# Patient Record
Sex: Male | Born: 1965 | Race: Asian | Hispanic: No | Marital: Single | Smoking: Current every day smoker
Health system: Southern US, Community
[De-identification: ages and names within clinical notes are randomized; demographics above are authoritative.]

## PROBLEM LIST (undated history)

## (undated) DIAGNOSIS — E7800 Pure hypercholesterolemia, unspecified: Secondary | ICD-10-CM

## (undated) DIAGNOSIS — E663 Overweight: Secondary | ICD-10-CM

## (undated) DIAGNOSIS — F419 Anxiety disorder, unspecified: Secondary | ICD-10-CM

## (undated) DIAGNOSIS — E119 Type 2 diabetes mellitus without complications: Secondary | ICD-10-CM

## (undated) DIAGNOSIS — R7989 Other specified abnormal findings of blood chemistry: Secondary | ICD-10-CM

## (undated) HISTORY — DX: Overweight: E66.3

## (undated) HISTORY — DX: Type 2 diabetes mellitus without complications: E11.9

## (undated) HISTORY — DX: Pure hypercholesterolemia, unspecified: E78.00

## (undated) HISTORY — DX: Other specified abnormal findings of blood chemistry: R79.89

## (undated) HISTORY — PX: OTHER SURGICAL HISTORY: SHX169

## (undated) HISTORY — DX: Anxiety disorder, unspecified: F41.9

---

## 2016-02-12 HISTORY — PX: COLONOSCOPY: SHX174

## 2019-06-23 ENCOUNTER — Encounter: Payer: Self-pay | Admitting: Nurse Practitioner

## 2019-06-23 ENCOUNTER — Ambulatory Visit (INDEPENDENT_AMBULATORY_CARE_PROVIDER_SITE_OTHER): Payer: BC Managed Care – PPO | Admitting: Nurse Practitioner

## 2019-06-23 ENCOUNTER — Other Ambulatory Visit: Payer: Self-pay

## 2019-06-23 VITALS — BP 120/70 | HR 78 | Temp 96.9°F | Ht 69.0 in | Wt 233.0 lb

## 2019-06-23 DIAGNOSIS — R7989 Other specified abnormal findings of blood chemistry: Secondary | ICD-10-CM

## 2019-06-23 DIAGNOSIS — E1165 Type 2 diabetes mellitus with hyperglycemia: Secondary | ICD-10-CM

## 2019-06-23 DIAGNOSIS — J302 Other seasonal allergic rhinitis: Secondary | ICD-10-CM

## 2019-06-23 DIAGNOSIS — E669 Obesity, unspecified: Secondary | ICD-10-CM

## 2019-06-23 DIAGNOSIS — E782 Mixed hyperlipidemia: Secondary | ICD-10-CM

## 2019-06-23 DIAGNOSIS — F17200 Nicotine dependence, unspecified, uncomplicated: Secondary | ICD-10-CM

## 2019-06-23 DIAGNOSIS — F324 Major depressive disorder, single episode, in partial remission: Secondary | ICD-10-CM

## 2019-06-23 DIAGNOSIS — F5101 Primary insomnia: Secondary | ICD-10-CM

## 2019-06-23 DIAGNOSIS — N529 Male erectile dysfunction, unspecified: Secondary | ICD-10-CM

## 2019-06-23 NOTE — Patient Instructions (Addendum)
  DENTIST Dr Linus Galas [address]  [place] [address]  [phone]    Please work on quitting smoking

## 2019-06-23 NOTE — Progress Notes (Signed)
Careteam: Patient Care Team: System, Pcp Not In as PCP - General  PLACE OF SERVICE:  [place]  Advanced Directive information    Allergies  Allergen Reactions  . Penicillins Rash    Chief Complaint  Patient presents with  . Establish Care    New Patient establish care and fasting labs  . Medication Management    FYI- patient was on Lipitor- not sure of dose and ran out  . Medication Refill    Refill all medications EXCEPT abilify. Patient will need testerone injection (has 2 doses left)      HPI: Patient is a 54 y.o. male to establish care.  Last physical was November 2020.  Depression- controlled on abilify 10 mg daily, previously followed by psychiatrist. Reports he was very depressed in the past but no SI or HI. Now stable and has been on abifiy for some time.   Insomnia- controlled on diphenhydramine 50 mg daily.   DM- last a1c 7.1 in October, currently on trulicitiy, glipizide and metformin 1000 mg daily,  Previous on twice daily but decreased due to improved glycemia control. No hypoglycemia.  Does not routinely take blood sugar  Uses ibuprofen occasionally for pain- once every 2 months.   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   Hyperlipidemia- previously on lipitor but has been out for a few months, fasting today  Right rotator cuff repair after accident last year.   Smoker- smoking for 30 years, 1/2 ppd  Works for [employer] in [place] and was relocated from [place].   Obesity- weight was 253 in [place], has lost 20 lbs with walking.  0 Review of Systems:  Review of Systems  Constitutional: Negative for chills, fever and weight loss.  HENT: Negative for tinnitus.   Respiratory: Negative for cough, sputum production and shortness of breath.   Cardiovascular: Negative for chest pain, palpitations and leg swelling.  Gastrointestinal: Negative for abdominal pain, constipation, diarrhea and heartburn.    Genitourinary: Negative for dysuria, frequency and urgency.  Musculoskeletal: Negative for back pain, falls, joint pain and myalgias.  Skin: Negative.   Neurological: Negative for dizziness and headaches.  Endo/Heme/Allergies: Positive for environmental allergies.  Psychiatric/Behavioral: Positive for depression. Negative for memory loss. The patient has insomnia.     Past Medical History:  Diagnosis Date  . Anxiety   . Diabetes ([place])   . High cholesterol   . Low testosterone in male   . Overweight    Past Surgical History:  Procedure Laterality Date  . COLONOSCOPY  2018  . None     Social History:   reports that he has been smoking cigarettes. He has a 15.00 pack-year smoking history. He has never used smokeless tobacco. He reports previous alcohol use. He reports that he does not use drugs.  Family History  Problem Relation Age of Onset  . Stroke Mother   . Stroke Father     Medications: Patient's Medications  New Prescriptions   No medications on file  Previous Medications   ARIPIPRAZOLE (ABILIFY) 10 MG TABLET    Take 10 mg by mouth daily.   DIPHENHYDRAMINE HCL, SLEEP, (SLEEP AID) 50 MG CAPS    Take 1 capsule by mouth at bedtime.   DULAGLUTIDE (TRULICITY) 4.49 QP/5.9FM SOPN    Inject 0.75 mg into the skin once a week.   GLIPIZIDE (GLUCOTROL) 5 MG TABLET    Take 5 mg by mouth daily before breakfast.  IBUPROFEN (ADVIL) 200 MG TABLET    Take 200 mg by mouth as needed.   METFORMIN (GLUMETZA) 1000 MG (MOD) 24 HR TABLET    Take 1,000 mg by mouth daily.    SILDENAFIL (VIAGRA) 100 MG TABLET    Take 100 mg by mouth as needed for erectile dysfunction.   TESTOSTERONE IM    Inject 25 mg into the muscle See admin instructions. Every 10 days  Modified Medications   No medications on file  Discontinued Medications   UNABLE TO FIND    25 mg daily. Med Name: Arpizol    Physical Exam:  Vitals:   06/23/19 0904  BP: 120/70  Pulse: 78  Temp: (!) 96.9 F (36.1 C)  TempSrc:  Temporal  SpO2: 99%  Weight: 233 lb (105.7 kg)  Height: '5\' 9"'$  (1.753 m)   Body mass index is 34.41 kg/m. Wt Readings from Last 3 Encounters:  06/23/19 233 lb (105.7 kg)    Physical Exam Constitutional:      General: He is not in acute distress.    Appearance: He is well-developed. He is not diaphoretic.  HENT:     Head: Normocephalic and atraumatic.     Mouth/Throat:     Pharynx: No oropharyngeal exudate.  Eyes:     Conjunctiva/sclera: Conjunctivae normal.     Pupils: Pupils are equal, round, and reactive to light.  Cardiovascular:     Rate and Rhythm: Normal rate and regular rhythm.     Heart sounds: Normal heart sounds.  Pulmonary:     Effort: Pulmonary effort is normal.     Breath sounds: Normal breath sounds.  Abdominal:     General: Bowel sounds are normal.     Palpations: Abdomen is soft.  Musculoskeletal:        General: No tenderness.     Cervical back: Normal range of motion and neck supple.  Skin:    General: Skin is warm and dry.  Neurological:     Mental Status: He is alert and oriented to person, place, and time.  Psychiatric:        Mood and Affect: Mood normal.        Behavior: Behavior normal.    Labs reviewed: Basic Metabolic Panel: No results for input(s): NA, K, CL, CO2, GLUCOSE, BUN, CREATININE, CALCIUM, MG, PHOS, TSH in the last 8760 hours. Liver Function Tests: No results for input(s): AST, ALT, ALKPHOS, BILITOT, PROT, ALBUMIN in the last 8760 hours. No results for input(s): LIPASE, AMYLASE in the last 8760 hours. No results for input(s): AMMONIA in the last 8760 hours. CBC: No results for input(s): WBC, NEUTROABS, HGB, HCT, MCV, PLT in the last 8760 hours. Lipid Panel: No results for input(s): CHOL, HDL, LDLCALC, TRIG, CHOLHDL, LDLDIRECT in the last 8760 hours. TSH: No results for input(s): TSH in the last 8760 hours. A1C: No results found for: HGBA1C   Assessment/Plan 1. Low testosterone -continues on testosterone injections,  previously prescribed by urology, reports he had a hard time getting level up and this is why he takes every 10 days, medical records have been requested.  - Ambulatory referral to Urology  2. Type 2 diabetes mellitus with hyperglycemia, without long-term current use of insulin (HCC) -reports A1c has been trending down. Does not check blood sugars routinely but reports no symptoms of hypoglycemia.  -Encouraged dietary compliance, routine foot care/monitoring and to keep up with diabetic eye exams through ophthalmology  - Ambulatory referral to Podiatry - Ambulatory referral to Ophthalmology -  CMP with eGFR([facility]) - CBC with Differential/Platelet - Hemoglobin A1c  3. Mixed hyperlipidemia -previously on lipitor but ran out, has made lifestyle modifications and lost weight.  -goal LDL <70, likely will need to restart statin. To continue dietary modifications and exercise. - Lipid Panel - CMP with eGFR([facility])  4. Obesity (BMI 30-39.9) -has lost weight, continues to work on dietary and increase in physical activity for healthy lifestyle.   5. Seasonal allergies -taking diphenhydramine nightly for sleep, continues on for allergy relief.    6. Depression, major, single episode, in partial remission ([place]) -reports this is well controlled at this time, previously followed by psych but no recent adjustments needed in medications. Continues on abilify 10 mg daily  7. Primary insomnia Stable at this time.  8. Erectile dysfunction, unspecified erectile dysfunction type -continues on viagra 100 mg PRN, followed by urology  9. Smoker Discussed and encouraged smoking cessation.   Next appt: 3 months for follow up.  James Woods. [place], [facility] [phone]

## 2019-06-24 ENCOUNTER — Other Ambulatory Visit: Payer: Self-pay | Admitting: Nurse Practitioner

## 2019-06-24 ENCOUNTER — Other Ambulatory Visit: Payer: Self-pay

## 2019-06-24 DIAGNOSIS — E782 Mixed hyperlipidemia: Secondary | ICD-10-CM

## 2019-06-24 LAB — COMPLETE METABOLIC PANEL WITH GFR
AG Ratio: 1.3 (calc) (ref 1.0–2.5)
ALT: 21 U/L (ref 9–46)
AST: 18 U/L (ref 10–35)
Albumin: 4.4 g/dL (ref 3.6–5.1)
Alkaline phosphatase (APISO): 74 U/L (ref 35–144)
BUN: 13 mg/dL (ref 7–25)
CO2: 28 mmol/L (ref 20–32)
Calcium: 9.5 mg/dL (ref 8.6–10.3)
Chloride: 100 mmol/L (ref 98–110)
Creat: 0.94 mg/dL (ref 0.70–1.33)
GFR, Est African American: 108 mL/min/{1.73_m2} (ref >=60)
GFR, Est Non African American: 93 mL/min/{1.73_m2} (ref >=60)
Globulin: 3.4 g/dL (calc) (ref 1.9–3.7)
Glucose, Bld: 114 mg/dL — ABNORMAL HIGH (ref 65–99)
Potassium: 4.8 mmol/L (ref 3.5–5.3)
Sodium: 134 mmol/L — ABNORMAL LOW (ref 135–146)
Total Bilirubin: 0.5 mg/dL (ref 0.2–1.2)
Total Protein: 7.8 g/dL (ref 6.1–8.1)

## 2019-06-24 LAB — CBC WITH DIFFERENTIAL/PLATELET
Absolute Monocytes: 380 cells/uL (ref 200–950)
Basophils Absolute: 73 cells/uL (ref 0–200)
Basophils Relative: 1 %
Eosinophils Absolute: 292 cells/uL (ref 15–500)
Eosinophils Relative: 4 %
HCT: 50.3 % — ABNORMAL HIGH (ref 38.5–50.0)
Hemoglobin: 16.2 g/dL (ref 13.2–17.1)
Lymphs Abs: 2095 cells/uL (ref 850–3900)
MCH: 25.8 pg — ABNORMAL LOW (ref 27.0–33.0)
MCHC: 32.2 g/dL (ref 32.0–36.0)
MCV: 80 fL (ref 80.0–100.0)
MPV: 9 fL (ref 7.5–12.5)
Monocytes Relative: 5.2 %
Neutro Abs: 4460 cells/uL (ref 1500–7800)
Neutrophils Relative %: 61.1 %
Platelets: 175 10*3/uL (ref 140–400)
RBC: 6.29 10*6/uL — ABNORMAL HIGH (ref 4.20–5.80)
RDW: 16.1 % — ABNORMAL HIGH (ref 11.0–15.0)
Total Lymphocyte: 28.7 %
WBC: 7.3 10*3/uL (ref 3.8–10.8)

## 2019-06-24 LAB — HEMOGLOBIN A1C
Hgb A1c MFr Bld: 6.2 % of total Hgb — ABNORMAL HIGH (ref <5.7)
Mean Plasma Glucose: 131 (calc)
eAG (mmol/L): 7.3 (calc)

## 2019-06-24 LAB — LIPID PANEL
Cholesterol: 202 mg/dL — ABNORMAL HIGH (ref <200)
HDL: 27 mg/dL — ABNORMAL LOW (ref >=40)
LDL Cholesterol (Calc): 141 mg/dL (calc) — ABNORMAL HIGH
Non-HDL Cholesterol (Calc): 175 mg/dL (calc) — ABNORMAL HIGH (ref <130)
Total CHOL/HDL Ratio: 7.5 (calc) — ABNORMAL HIGH (ref <5.0)
Triglycerides: 198 mg/dL — ABNORMAL HIGH (ref <150)

## 2019-06-24 MED ORDER — ATORVASTATIN CALCIUM 20 MG PO TABS
20.0000 mg | ORAL_TABLET | Freq: Every day | ORAL | 0 refills | Status: DC
Start: 2019-06-24 — End: 2019-06-25

## 2019-06-24 NOTE — Progress Notes (Signed)
Per PCP Sharon Seller, NP patient is to start 1/2 tablet for 2 week then increase to 1 tablet. Note added for pharmacist to remind patient.

## 2019-06-25 ENCOUNTER — Telehealth: Payer: Self-pay | Admitting: *Deleted

## 2019-06-25 MED ORDER — GLIPIZIDE 5 MG PO TABS: 5.0000 mg | ORAL_TABLET | Freq: Two times a day (BID) | ORAL | Status: DC

## 2019-06-25 NOTE — Telephone Encounter (Signed)
Patient wife, Samen walked into office with patient's medications. Reviewed medications and made corrections on medication list. Showed Jessica list and received verbal approval to change in patient's medication list. Updated current medication list.

## 2019-06-30 ENCOUNTER — Other Ambulatory Visit: Payer: Self-pay

## 2019-06-30 NOTE — Telephone Encounter (Signed)
Patient stated he had no more medication. I asked him if he had any refills left at his previous pharmacy and he denied having any refills. He stated he moved here from [place] and his doctor there would not treat him since he was out of state and he thought that we would refill his medications. I stated that we had not received his records and did not know how to dose the medication. He eventually hung up on me.

## 2019-06-30 NOTE — Telephone Encounter (Addendum)
James Woods said she had made a referral to [facility] and that they would handle ordering the testosterone and associated supplies.  I called patient and relayed that information. A referral was made, but the patient stated he had not been contacted yet.

## 2019-08-04 ENCOUNTER — Ambulatory Visit: Payer: BC Managed Care – PPO | Admitting: Podiatry

## 2019-08-18 DIAGNOSIS — H0102B Squamous blepharitis left eye, upper and lower eyelids: Secondary | ICD-10-CM | POA: Diagnosis not present

## 2019-08-18 DIAGNOSIS — H52223 Regular astigmatism, bilateral: Secondary | ICD-10-CM | POA: Diagnosis not present

## 2019-08-18 DIAGNOSIS — E119 Type 2 diabetes mellitus without complications: Secondary | ICD-10-CM | POA: Diagnosis not present

## 2019-08-18 DIAGNOSIS — Z125 Encounter for screening for malignant neoplasm of prostate: Secondary | ICD-10-CM | POA: Diagnosis not present

## 2019-08-18 DIAGNOSIS — E349 Endocrine disorder, unspecified: Secondary | ICD-10-CM | POA: Diagnosis not present

## 2019-08-18 DIAGNOSIS — H0102A Squamous blepharitis right eye, upper and lower eyelids: Secondary | ICD-10-CM | POA: Diagnosis not present

## 2019-08-18 DIAGNOSIS — H5203 Hypermetropia, bilateral: Secondary | ICD-10-CM | POA: Diagnosis not present

## 2019-08-18 DIAGNOSIS — H524 Presbyopia: Secondary | ICD-10-CM | POA: Diagnosis not present

## 2019-08-18 DIAGNOSIS — H04123 Dry eye syndrome of bilateral lacrimal glands: Secondary | ICD-10-CM | POA: Diagnosis not present

## 2019-08-18 LAB — HM DIABETES EYE EXAM

## 2019-08-19 ENCOUNTER — Ambulatory Visit: Payer: BC Managed Care – PPO | Admitting: Podiatry

## 2019-08-19 ENCOUNTER — Other Ambulatory Visit: Payer: Self-pay

## 2019-08-19 DIAGNOSIS — E119 Type 2 diabetes mellitus without complications: Secondary | ICD-10-CM | POA: Diagnosis not present

## 2019-08-19 DIAGNOSIS — L603 Nail dystrophy: Secondary | ICD-10-CM

## 2019-08-19 MED ORDER — FLUCONAZOLE 150 MG PO TABS
150.0000 mg | ORAL_TABLET | ORAL | 2 refills | Status: DC
Start: 2019-08-19 — End: 2019-11-24

## 2019-08-19 NOTE — Progress Notes (Signed)
  Subjective:  Patient ID: James Woods, male    DOB: 12/02/1965,  MRN: [id number]  Chief Complaint  Patient presents with  . Diabetes Mellitus    Diabetic foot exam  . Nail Problem    Nail trim 1-5 bilateral  . Callouses    Bilateral plantar heel callouses    54 y.o. male presents with the above complaint. History confirmed with patient. Denies numbness and tingling in his feet for the most part but did a month ago in his heels. Reports burning in his thighs when he walks after a couple miles.   A1c 6.2 a month ago. PCP Dr Victorino Dike  Objective:  Physical Exam: warm, good capillary refill, nail exam normal nails without lesions, no trophic changes or ulcerative lesions. DP pulses palpable, PT pulses palpable and protective sensation intact Left Foot: normal exam, no swelling, tenderness, instability; ligaments intact, full range of motion of all ankle/foot joints  Right Foot: normal exam, no swelling, tenderness, instability; ligaments intact, full range of motion of all ankle/foot joints    HPKs bilat heels, left 5th toe, left hallux  No images are attached to the encounter.  Assessment:   1. Encounter for diabetic foot exam (HCC)   2. Nail dystrophy      Plan:  Patient was evaluated and treated and all questions answered.  DM without complication -Nails debrided x10 -DM risk 0 -Educated on DM footcare -Heels debrided. Recc revitaderm. -Advised he does not meet criteria for at risk foot care. He would like to come back periodically for non-covered nail care.  Return in about 2 months (around 10/20/2019) for Diabetic Foot Care.

## 2019-08-20 ENCOUNTER — Encounter: Payer: Self-pay | Admitting: Nurse Practitioner

## 2019-09-17 ENCOUNTER — Other Ambulatory Visit: Payer: Self-pay | Admitting: Nurse Practitioner

## 2019-09-22 ENCOUNTER — Ambulatory Visit: Payer: BC Managed Care – PPO | Admitting: Nurse Practitioner

## 2019-09-22 DIAGNOSIS — E349 Endocrine disorder, unspecified: Secondary | ICD-10-CM | POA: Diagnosis not present

## 2019-10-07 DIAGNOSIS — E349 Endocrine disorder, unspecified: Secondary | ICD-10-CM | POA: Diagnosis not present

## 2019-10-22 ENCOUNTER — Ambulatory Visit: Payer: BC Managed Care – PPO | Admitting: Podiatry

## 2019-11-03 ENCOUNTER — Ambulatory Visit: Payer: BC Managed Care – PPO | Admitting: Nurse Practitioner

## 2019-11-24 ENCOUNTER — Other Ambulatory Visit: Payer: Self-pay

## 2019-11-24 ENCOUNTER — Ambulatory Visit (INDEPENDENT_AMBULATORY_CARE_PROVIDER_SITE_OTHER): Payer: BC Managed Care – PPO | Admitting: Nurse Practitioner

## 2019-11-24 ENCOUNTER — Encounter: Payer: Self-pay | Admitting: Nurse Practitioner

## 2019-11-24 VITALS — BP 124/86 | HR 81 | Temp 97.1°F | Ht 69.0 in | Wt 224.0 lb

## 2019-11-24 DIAGNOSIS — F324 Major depressive disorder, single episode, in partial remission: Secondary | ICD-10-CM | POA: Diagnosis not present

## 2019-11-24 DIAGNOSIS — F419 Anxiety disorder, unspecified: Secondary | ICD-10-CM

## 2019-11-24 DIAGNOSIS — E1165 Type 2 diabetes mellitus with hyperglycemia: Secondary | ICD-10-CM | POA: Insufficient documentation

## 2019-11-24 DIAGNOSIS — Z1159 Encounter for screening for other viral diseases: Secondary | ICD-10-CM | POA: Diagnosis not present

## 2019-11-24 DIAGNOSIS — J302 Other seasonal allergic rhinitis: Secondary | ICD-10-CM

## 2019-11-24 DIAGNOSIS — F5101 Primary insomnia: Secondary | ICD-10-CM | POA: Diagnosis not present

## 2019-11-24 DIAGNOSIS — E291 Testicular hypofunction: Secondary | ICD-10-CM | POA: Diagnosis not present

## 2019-11-24 DIAGNOSIS — R7989 Other specified abnormal findings of blood chemistry: Secondary | ICD-10-CM

## 2019-11-24 DIAGNOSIS — E782 Mixed hyperlipidemia: Secondary | ICD-10-CM

## 2019-11-24 DIAGNOSIS — Z125 Encounter for screening for malignant neoplasm of prostate: Secondary | ICD-10-CM | POA: Diagnosis not present

## 2019-11-24 DIAGNOSIS — K5903 Drug induced constipation: Secondary | ICD-10-CM

## 2019-11-24 MED ORDER — ESCITALOPRAM OXALATE 10 MG PO TABS: 10.0000 mg | ORAL_TABLET | Freq: Every day | ORAL | 1 refills | Status: DC

## 2019-11-24 MED ORDER — LISINOPRIL 5 MG PO TABS: ORAL_TABLET | ORAL | 1 refills | Status: DC

## 2019-11-24 NOTE — Progress Notes (Signed)
Careteam: Patient Care Team: Sharon Seller, NP as PCP - General (Geriatric Medicine)  PLACE OF SERVICE:  [facility]  Advanced Directive information    Allergies  Allergen Reactions   Penicillins Rash    Chief Complaint  Patient presents with   Medical Management of Chronic Issues    3 month follow-up. Patient stopped glipizide and abilify on his own. Patient started Lipitor 4 weeks ago. Discuss need for MALB, Hep C, HIV Screening, TD/Tdap and colonoscopy      HPI: Patient is a 54 y.o. male for routine follow up.  Reports he stopped Abilify did not feel like it provided benefit, has been on wellbutrin (was on for a long time), clonazepam. Continues to have feeling of depression.  Reports his body is tolerating it. Reports more anxiety than depression at this time. Has not done counseling in the past. No thoughts of hurting himself. Reports mood swings but are minor. Not major issue but triggered by anxiety and depression.   Low testosterone- following with urologist and having injection weekly.   Obesity- attempting to walk more.  Insomnia- sleeps with OTC sleep aid.   DM- does not check blood sugars, stopped glipizide. Taking trulity and metformin daily (could not tolerate increase in dose to twice daily due to side effects) Has been to eye doctor and foot doctor.   Hyperlipidemia- just started lipitor 40 mg for 1 months.   Had colonoscopy when he was 51, recommended follow up in 5 years   Review of Systems:  Review of Systems  Constitutional: Negative for chills, fever and weight loss.  HENT: Negative for tinnitus.   Respiratory: Negative for cough, sputum production and shortness of breath.   Cardiovascular: Negative for chest pain, palpitations and leg swelling.  Gastrointestinal: Negative for abdominal pain, constipation, diarrhea and heartburn.  Genitourinary: Negative for dysuria, frequency and urgency.  Musculoskeletal: Negative for back pain,  joint pain and myalgias.  Skin: Negative.   Neurological: Negative for dizziness and headaches.  Psychiatric/Behavioral: Positive for depression. Negative for memory loss. The patient is nervous/anxious. The patient does not have insomnia.     Past Medical History:  Diagnosis Date   Anxiety    Diabetes (HCC)    High cholesterol    Low testosterone in male    Overweight    Past Surgical History:  Procedure Laterality Date   COLONOSCOPY  2018   None     Social History:   reports that he has been smoking cigarettes. He has a 15.00 pack-year smoking history. He has never used smokeless tobacco. He reports previous alcohol use. He reports that he does not use drugs.  Family History  Problem Relation Age of Onset   Stroke Mother    Diabetes Mother    Stroke Father     Medications: Patient's Medications  New Prescriptions   No medications on file  Previous Medications   ATORVASTATIN (LIPITOR) 40 MG TABLET    Take 40 mg by mouth daily.   DIPHENHYDRAMINE HCL, SLEEP, (SLEEP AID) 50 MG CAPS    Take 1 capsule by mouth at bedtime.   DULAGLUTIDE (TRULICITY) 0.75 MG/0.5ML SOPN    Inject 0.75 mg into the skin once a week.   IBUPROFEN (ADVIL) 200 MG TABLET    Take 200 mg by mouth as needed.   METFORMIN (GLUCOPHAGE) 1000 MG TABLET    Take 1,000 mg by mouth daily with breakfast.   SILDENAFIL (VIAGRA) 100 MG TABLET    Take 100  mg by mouth as needed for erectile dysfunction.   TESTOSTERONE CYPIONATE 200 MG/ML SOLN    Inject 200mg  total (79ml) into muscle every 7 days.  Modified Medications   No medications on file  Discontinued Medications   ARIPIPRAZOLE (ABILIFY) 5 MG TABLET    Take 5 mg by mouth daily.   FLUCONAZOLE (DIFLUCAN) 150 MG TABLET    Take 1 tablet (150 mg total) by mouth once a week.   GLIPIZIDE (GLUCOTROL) 5 MG TABLET    Take 1 tablet (5 mg total) by mouth 2 (two) times daily before a meal.    Physical Exam:  Vitals:   11/24/19 0959  BP: 124/86  Pulse: 81    Temp: (!) 97.1 F (36.2 C)  TempSrc: Temporal  SpO2: 98%  Weight: 224 lb (101.6 kg)  Height: 5\' 9"  (1.753 m)   Body mass index is 33.08 kg/m. Wt Readings from Last 3 Encounters:  11/24/19 224 lb (101.6 kg)  06/23/19 233 lb (105.7 kg)    Physical Exam Constitutional:      General: He is not in acute distress.    Appearance: He is well-developed. He is not diaphoretic.  HENT:     Head: Normocephalic and atraumatic.     Mouth/Throat:     Pharynx: No oropharyngeal exudate.  Eyes:     Conjunctiva/sclera: Conjunctivae normal.     Pupils: Pupils are equal, round, and reactive to light.  Cardiovascular:     Rate and Rhythm: Normal rate and regular rhythm.     Heart sounds: Normal heart sounds.  Pulmonary:     Effort: Pulmonary effort is normal.     Breath sounds: Normal breath sounds.  Abdominal:     General: Bowel sounds are normal.     Palpations: Abdomen is soft.  Musculoskeletal:        General: No tenderness.     Cervical back: Normal range of motion and neck supple.  Skin:    General: Skin is warm and dry.  Neurological:     Mental Status: He is alert and oriented to person, place, and time.  Psychiatric:        Mood and Affect: Mood normal.        Behavior: Behavior normal.     Labs reviewed: Basic Metabolic Panel: Recent Labs    06/23/19 0942  NA 134*  K 4.8  CL 100  CO2 28  GLUCOSE 114*  BUN 13  CREATININE 0.94  CALCIUM 9.5   Liver Function Tests: Recent Labs    06/23/19 0942  AST 18  ALT 21  BILITOT 0.5  PROT 7.8   No results for input(s): LIPASE, AMYLASE in the last 8760 hours. No results for input(s): AMMONIA in the last 8760 hours. CBC: Recent Labs    06/23/19 0942  WBC 7.3  NEUTROABS 4,460  HGB 16.2  HCT 50.3*  MCV 80.0  PLT 175   Lipid Panel: Recent Labs    06/23/19 0942  CHOL 202*  HDL 27*  LDLCALC 141*  TRIG 198*  CHOLHDL 7.5*   TSH: No results for input(s): TSH in the last 8760 hours. A1C: Lab Results   Component Value Date   HGBA1C 6.2 (H) 06/23/2019     Assessment/Plan 1. Mixed hyperlipidemia -started lipitor 4 weeks ago, encouraged dietary modifications as well.  - Lipid Panel - COMPLETE METABOLIC PANEL WITH GFR  2. Depression, major, single episode, in partial remission (HCC) -improved but still reports symptoms. Will start lexapro at this time.  -  escitalopram (LEXAPRO) 10 MG tablet; Take 1 tablet (10 mg total) by mouth daily.  Dispense: 30 tablet; Refill: 1  3. Type 2 diabetes mellitus with hyperglycemia, without long-term current use of insulin (HCC) -stopped glipizide, reports hx of hypoglycemia on metformin BID.  -a1c was at goal on previous labs but has now stopped glipizide.  - CBC with Differential/Platelet - Hemoglobin A1c - lisinopril (ZESTRIL) 5 MG tablet; 1 tablet daily for kidney protection due to diabetes  Dispense: 90 tablet; Refill: 1 - Microalbumin, urine  4. Primary insomnia Controlled on otc.   5. Need for hepatitis C screening test - Hepatitis C antibody  6. Anxiety -ongoing, stopped abilify did not feel like it was effective.  - escitalopram (LEXAPRO) 10 MG tablet; Take 1 tablet (10 mg total) by mouth daily.  Dispense: 30 tablet; Refill: 1  7. Low testosterone -on supplement through urology  8. Seasonal allergies -controlled at this time.  9. Drug-induced constipation Suspect due to OTC sleep aid, to increase water, fiber and activity. Educated on suspected cause. Also does not eat much fruits or vegetables so dietary modifications encouraged.   Next appt: 4 weeks to follow up on mood.  Janene [place]. Biagio Borg  [facility] [phone]

## 2019-11-24 NOTE — Patient Instructions (Addendum)
To start lexapro 10 mg daily for depression/anxeity To stop and notify if you have any feelings of hurting yourself or others or if symptoms worsen  To increase fiber in diet for constipation- may use benefiber, metamucil which is OTC Also can use colace tablet  Daily  Follow up in 4 weeks for mood (can be virtual visit) Follow up in 6 months for routine follow up (in office)  Constipation, Adult Constipation is when a person has fewer bowel movements in a week than normal, has difficulty having a bowel movement, or has stools that are dry, hard, or larger than normal. Constipation may be caused by an underlying condition. It may become worse with age if a person takes certain medicines and does not take in enough fluids. Follow these instructions at home: Eating and drinking   Eat foods that have a lot of fiber, such as fresh fruits and vegetables, whole grains, and beans.  Limit foods that are high in fat, low in fiber, or overly processed, such as french fries, hamburgers, cookies, candies, and soda.  Drink enough fluid to keep your urine clear or pale yellow. General instructions  Exercise regularly or as told by your health care provider.  Go to the restroom when you have the urge to go. Do not hold it in.  Take over-the-counter and prescription medicines only as told by your health care provider. These include any fiber supplements.  Practice pelvic floor retraining exercises, such as deep breathing while relaxing the lower abdomen and pelvic floor relaxation during bowel movements.  Watch your condition for any changes.  Keep all follow-up visits as told by your health care provider. This is important. Contact a health care provider if:  You have pain that gets worse.  You have a fever.  You do not have a bowel movement after 4 days.  You vomit.  You are not hungry.  You lose weight.  You are bleeding from the anus.  You have thin, pencil-like stools. Get help  right away if:  You have a fever and your symptoms suddenly get worse.  You leak stool or have blood in your stool.  Your abdomen is bloated.  You have severe pain in your abdomen.  You feel dizzy or you faint. This information is not intended to replace advice given to you by your health care provider. Make sure you discuss any questions you have with your health care provider. Document Revised: 01/10/2017 Document Reviewed: 07/19/2015 Elsevier Patient Education  2020 [employer].

## 2019-11-25 LAB — CBC WITH DIFFERENTIAL/PLATELET
Absolute Monocytes: 432 cells/uL (ref 200–950)
Basophils Absolute: 91 cells/uL (ref 0–200)
Basophils Relative: 1.1 %
Eosinophils Absolute: 282 cells/uL (ref 15–500)
Eosinophils Relative: 3.4 %
HCT: 48.7 % (ref 38.5–50.0)
Hemoglobin: 16.3 g/dL (ref 13.2–17.1)
Lymphs Abs: 1975 cells/uL (ref 850–3900)
MCH: 27.9 pg (ref 27.0–33.0)
MCHC: 33.5 g/dL (ref 32.0–36.0)
MCV: 83.4 fL (ref 80.0–100.0)
MPV: 9.8 fL (ref 7.5–12.5)
Monocytes Relative: 5.2 %
Neutro Abs: 5520 cells/uL (ref 1500–7800)
Neutrophils Relative %: 66.5 %
Platelets: 187 10*3/uL (ref 140–400)
RBC: 5.84 10*6/uL — ABNORMAL HIGH (ref 4.20–5.80)
RDW: 13.5 % (ref 11.0–15.0)
Total Lymphocyte: 23.8 %
WBC: 8.3 10*3/uL (ref 3.8–10.8)

## 2019-11-25 LAB — COMPLETE METABOLIC PANEL WITH GFR
AG Ratio: 1.3 (calc) (ref 1.0–2.5)
ALT: 22 U/L (ref 9–46)
AST: 18 U/L (ref 10–35)
Albumin: 4.4 g/dL (ref 3.6–5.1)
Alkaline phosphatase (APISO): 78 U/L (ref 35–144)
BUN: 13 mg/dL (ref 7–25)
CO2: 22 mmol/L (ref 20–32)
Calcium: 9.5 mg/dL (ref 8.6–10.3)
Chloride: 102 mmol/L (ref 98–110)
Creat: 0.85 mg/dL (ref 0.70–1.33)
GFR, Est African American: 115 mL/min/{1.73_m2} (ref >=60)
GFR, Est Non African American: 99 mL/min/{1.73_m2} (ref >=60)
Globulin: 3.3 g/dL (calc) (ref 1.9–3.7)
Glucose, Bld: 101 mg/dL — ABNORMAL HIGH (ref 65–99)
Potassium: 4.2 mmol/L (ref 3.5–5.3)
Sodium: 135 mmol/L (ref 135–146)
Total Bilirubin: 0.5 mg/dL (ref 0.2–1.2)
Total Protein: 7.7 g/dL (ref 6.1–8.1)

## 2019-11-25 LAB — HEMOGLOBIN A1C
Hgb A1c MFr Bld: 5.7 % of total Hgb — ABNORMAL HIGH (ref <5.7)
Mean Plasma Glucose: 117 (calc)
eAG (mmol/L): 6.5 (calc)

## 2019-11-25 LAB — HEPATITIS C ANTIBODY
Hepatitis C Ab: NONREACTIVE
SIGNAL TO CUT-OFF: 0.1 (ref <1.00)

## 2019-11-25 LAB — LIPID PANEL
Cholesterol: 162 mg/dL (ref <200)
HDL: 29 mg/dL — ABNORMAL LOW (ref >=40)
LDL Cholesterol (Calc): 101 mg/dL (calc) — ABNORMAL HIGH
Non-HDL Cholesterol (Calc): 133 mg/dL (calc) — ABNORMAL HIGH (ref <130)
Total CHOL/HDL Ratio: 5.6 (calc) — ABNORMAL HIGH (ref <5.0)
Triglycerides: 199 mg/dL — ABNORMAL HIGH (ref <150)

## 2019-11-25 LAB — MICROALBUMIN, URINE: Microalb, Ur: 0.5 mg/dL

## 2019-12-01 DIAGNOSIS — E291 Testicular hypofunction: Secondary | ICD-10-CM | POA: Diagnosis not present

## 2019-12-18 ENCOUNTER — Other Ambulatory Visit: Payer: Self-pay | Admitting: Nurse Practitioner

## 2019-12-18 DIAGNOSIS — F419 Anxiety disorder, unspecified: Secondary | ICD-10-CM

## 2019-12-18 DIAGNOSIS — F324 Major depressive disorder, single episode, in partial remission: Secondary | ICD-10-CM

## 2019-12-24 ENCOUNTER — Telehealth: Payer: Self-pay | Admitting: Nurse Practitioner

## 2019-12-24 ENCOUNTER — Other Ambulatory Visit: Payer: Self-pay

## 2019-12-24 ENCOUNTER — Telehealth: Payer: Self-pay

## 2019-12-24 ENCOUNTER — Encounter: Payer: Self-pay | Admitting: Nurse Practitioner

## 2019-12-24 ENCOUNTER — Telehealth: Payer: Self-pay | Admitting: *Deleted

## 2019-12-24 ENCOUNTER — Ambulatory Visit (INDEPENDENT_AMBULATORY_CARE_PROVIDER_SITE_OTHER): Payer: BC Managed Care – PPO | Admitting: Nurse Practitioner

## 2019-12-24 DIAGNOSIS — F324 Major depressive disorder, single episode, in partial remission: Secondary | ICD-10-CM | POA: Diagnosis not present

## 2019-12-24 DIAGNOSIS — F419 Anxiety disorder, unspecified: Secondary | ICD-10-CM

## 2019-12-24 DIAGNOSIS — R7989 Other specified abnormal findings of blood chemistry: Secondary | ICD-10-CM

## 2019-12-24 MED ORDER — VILAZODONE HCL 20 MG PO TABS: ORAL_TABLET | ORAL | 1 refills | Status: DC

## 2019-12-24 NOTE — Telephone Encounter (Signed)
I called to schedule a 6 week follow up appt for mood with Janyth Contes, somewhere around the end of December.

## 2019-12-24 NOTE — Progress Notes (Signed)
This service is provided via telemedicine  No vital signs collected/recorded due to the encounter was a telemedicine visit.   Location of patient (ex: home, work):  Home  Patient consents to a telephone visit:  Yes, see encounter dated 12/24/2019  Location of the provider (ex: office, home):  [facility]  Name of any referring provider:  N/A  Names of all persons participating in the telemedicine service and their role in the encounter:  Abbey Chatters, Nurse Practitioner, Elveria Royals, CMA, and patient.   Time spent on call:  7 minutes with medical assistant

## 2019-12-24 NOTE — Telephone Encounter (Signed)
Received Prior Authorization from [facility] for Viibryd.  Initiated through [employer] into determination.   Covered Alternatives: Paroxetine Sertraline Trintellix (tier 2) Citalopram  Key: [id number] PA Case ID: [id number] Rx#: [id number]

## 2019-12-24 NOTE — Telephone Encounter (Signed)
Mr. James Woods, hedden are scheduled for a virtual visit with your provider today.    Just as we do with appointments in the office, we must obtain your consent to participate.  Your consent will be active for this visit and any virtual visit you may have with one of our providers in the next 365 days.    If you have a MyChart account, I can also send a copy of this consent to you electronically.  All virtual visits are billed to your insurance company just like a traditional visit in the office.  As this is a virtual visit, video technology does not allow for your provider to perform a traditional examination.  This may limit your provider's ability to fully assess your condition.  If your provider identifies any concerns that need to be evaluated in person or the need to arrange testing such as labs, EKG, etc, we will make arrangements to do so.    Although advances in technology are sophisticated, we cannot ensure that it will always work on either your end or our end.  If the connection with a video visit is poor, we may have to switch to a telephone visit.  With either a video or telephone visit, we are not always able to ensure that we have a secure connection.   I need to obtain your verbal consent now.   Are you willing to proceed with your visit today?   Trevan Wajda has provided verbal consent on 12/24/2019 for a virtual visit (video or telephone).   Elveria Royals, CMA 12/24/2019  8:51 AM

## 2019-12-24 NOTE — Progress Notes (Signed)
Careteam: Patient Care Team: Sharon Seller, NP as PCP - General (Geriatric Medicine)  Advanced Directive information    Allergies  Allergen Reactions  . Penicillins Rash    Chief Complaint  Patient presents with  . Follow-up    4 week follow up on mood. Patient states that he has not seen any change.  . Best Practice Recommendations    Pneumonia vaccine, Tetanus/Tdap vaccine, Flu vaccine  . Quality Metric Gaps    Colonoscopy     HPI: Patient is a 54 y.o. male for follow up on depression/anxiety.  Started on lexapro 10 mg daily.  Unable to ejaculate with medication. Has hx of low testosterone.  Did not feel like lexopro provided any benefit to anxiety or depression.  Overall feels like anxiety is more of an issue then depression.   Started lisinopril at last visit for renal protection since he has diabetes. No side effects noted   Review of Systems:  Review of Systems  Constitutional: Negative for chills, fever and malaise/fatigue.  Respiratory: Negative for cough.   Genitourinary:       Low testosterone   Psychiatric/Behavioral: Negative for depression and memory loss. The patient is nervous/anxious.     Past Medical History:  Diagnosis Date  . Anxiety   . Diabetes (HCC)   . High cholesterol   . Low testosterone in male   . Overweight    Past Surgical History:  Procedure Laterality Date  . COLONOSCOPY  2018  . None     Social History:   reports that he has been smoking cigarettes. He has a 15.00 pack-year smoking history. He has never used smokeless tobacco. He reports previous alcohol use. He reports that he does not use drugs.  Family History  Problem Relation Age of Onset  . Stroke Mother   . Diabetes Mother   . Stroke Father     Medications: Patient's Medications  New Prescriptions   No medications on file  Previous Medications   ATORVASTATIN (LIPITOR) 40 MG TABLET    Take 40 mg by mouth daily.   DIPHENHYDRAMINE HCL, SLEEP, (SLEEP  AID) 50 MG CAPS    Take 1 capsule by mouth at bedtime.   DULAGLUTIDE (TRULICITY) 0.75 MG/0.5ML SOPN    Inject 0.75 mg into the skin once a week.   ESCITALOPRAM (LEXAPRO) 10 MG TABLET    TAKE 1 TABLET BY MOUTH EVERY DAY   LISINOPRIL (ZESTRIL) 5 MG TABLET    1 tablet daily for kidney protection due to diabetes   METFORMIN (GLUCOPHAGE) 1000 MG TABLET    Take 1,000 mg by mouth daily with breakfast.   SILDENAFIL (VIAGRA) 100 MG TABLET    Take 100 mg by mouth as needed for erectile dysfunction.   TESTOSTERONE CYPIONATE 200 MG/ML SOLN    Inject 200mg  total (6ml) into muscle every 7 days.  Modified Medications   No medications on file  Discontinued Medications   No medications on file    Physical Exam:  There were no vitals filed for this visit. There is no height or weight on file to calculate BMI. Wt Readings from Last 3 Encounters:  11/24/19 224 lb (101.6 kg)  06/23/19 233 lb (105.7 kg)      Labs reviewed: Basic Metabolic Panel: Recent Labs    06/23/19 0942 11/24/19 1039  NA 134* 135  K 4.8 4.2  CL 100 102  CO2 28 22  GLUCOSE 114* 101*  BUN 13 13  CREATININE 0.94 0.85  CALCIUM 9.5 9.5   Liver Function Tests: Recent Labs    06/23/19 0942 11/24/19 1039  AST 18 18  ALT 21 22  BILITOT 0.5 0.5  PROT 7.8 7.7   No results for input(s): LIPASE, AMYLASE in the last 8760 hours. No results for input(s): AMMONIA in the last 8760 hours. CBC: Recent Labs    06/23/19 0942 11/24/19 1039  WBC 7.3 8.3  NEUTROABS 4,460 5,520  HGB 16.2 16.3  HCT 50.3* 48.7  MCV 80.0 83.4  PLT 175 187   Lipid Panel: Recent Labs    06/23/19 0942 11/24/19 1039  CHOL 202* 162  HDL 27* 29*  LDLCALC 141* 785*  TRIG 198* 199*  CHOLHDL 7.5* 5.6*   TSH: No results for input(s): TSH in the last 8760 hours. A1C: Lab Results  Component Value Date   HGBA1C 5.7 (H) 11/24/2019     Assessment/Plan 1. Depression, major, single episode, in partial remission (HCC) -overall improved but still  with symptoms. Anxiety likely contributing.  - Vilazodone HCl 20 MG TABS; 1/2 tablet daily for 1 week then increase to 1 tablet  Dispense: 30 tablet; Refill: 1  2. Anxiety -to stop lexapro due to side effects -will try vilazodone. At this time. If unable to afford medication or intolerant will try buspar 10 mg BID - Vilazodone HCl 20 MG TABS; 1/2 tablet daily for 1 week then increase to 1 tablet  Dispense: 30 tablet; Refill: 1  3. Low testosterone Noted and followed by urology, does not wish to be on medication with known sexual side effects.   Next appt: 6 weeks.  Janene Harvey. Biagio Borg  [facility] [phone]    Virtual Visit via telephone  I connected with patient on 12/24/19 at  8:30 AM EST by telephone and verified that I am speaking with the correct person using two identifiers.  Location: Patient: car Provider: office   I discussed the limitations, risks, security and privacy concerns of performing an evaluation and management service by telephone and the availability of in person appointments. I also discussed with the patient that there may be a patient responsible charge related to this service. The patient expressed understanding and agreed to proceed.   I discussed the assessment and treatment plan with the patient. The patient was provided an opportunity to ask questions and all were answered. The patient agreed with the plan and demonstrated an understanding of the instructions.   The patient was advised to call back or seek an in-person evaluation if the symptoms worsen or if the condition fails to improve as anticipated.  I provided 16 minutes of non-face-to-face time during this encounter.  Janene Harvey. Biagio Borg Avs printed and mailed

## 2020-01-11 ENCOUNTER — Telehealth: Payer: Self-pay

## 2020-01-11 DIAGNOSIS — F419 Anxiety disorder, unspecified: Secondary | ICD-10-CM

## 2020-01-11 MED ORDER — BUSPIRONE HCL 5 MG PO TABS: 5.0000 mg | ORAL_TABLET | Freq: Two times a day (BID) | ORAL | 1 refills | Status: DC

## 2020-01-11 NOTE — Telephone Encounter (Signed)
Called pt and discussed

## 2020-01-11 NOTE — Telephone Encounter (Signed)
Patient states he was called by Areatha Keas today and msg was left to call back.  Please confirm to Viibryd is no longer covered?  He has tried Trintellix and the past and would preferto stick with the Viibryd.   To Areatha Keas.

## 2020-01-11 NOTE — Telephone Encounter (Signed)
Called patient, Per Abbey Chatters, NP request and asked patient if he received the Viibryd. There was a letter of adverse determination for coverage of medication. Please advise.  Message routed to Abbey Chatters, NP

## 2020-01-11 NOTE — Telephone Encounter (Signed)
viibryd not covered based on notification provided by insurance, pt reports he did not attempt to pick up. Reports he would like to try another medication that does not have sexual side effects. Reports anxiety more bothersome than depression at this time. Will send in rx for buspar 5 mg BID

## 2020-02-08 ENCOUNTER — Other Ambulatory Visit: Payer: Self-pay | Admitting: Nurse Practitioner

## 2020-02-08 DIAGNOSIS — F419 Anxiety disorder, unspecified: Secondary | ICD-10-CM

## 2020-02-18 ENCOUNTER — Ambulatory Visit: Payer: BC Managed Care – PPO | Admitting: Nurse Practitioner

## 2020-03-01 ENCOUNTER — Other Ambulatory Visit: Payer: Self-pay

## 2020-03-01 ENCOUNTER — Telehealth (INDEPENDENT_AMBULATORY_CARE_PROVIDER_SITE_OTHER): Payer: BC Managed Care – PPO | Admitting: Nurse Practitioner

## 2020-03-01 ENCOUNTER — Encounter: Payer: Self-pay | Admitting: Nurse Practitioner

## 2020-03-01 DIAGNOSIS — E782 Mixed hyperlipidemia: Secondary | ICD-10-CM

## 2020-03-01 DIAGNOSIS — E1165 Type 2 diabetes mellitus with hyperglycemia: Secondary | ICD-10-CM | POA: Diagnosis not present

## 2020-03-01 DIAGNOSIS — F419 Anxiety disorder, unspecified: Secondary | ICD-10-CM | POA: Diagnosis not present

## 2020-03-01 NOTE — Progress Notes (Signed)
   This service is provided via telemedicine  No vital signs collected/recorded due to the encounter was a telemedicine visit.   Location of patient (ex: home, work):  Home  Patient consents to a telephone visit: Yes, see telephone visit dated 12/24/2019  Location of the provider (ex: office, home):  [facility], Office   Name of any referring provider:  N/A  Names of all persons participating in the telemedicine service and their role in the encounter:  S.Chrae B/CMA, Abbey Chatters, NP, and Patient   Time spent on call:  9 min with medical assistant

## 2020-03-01 NOTE — Progress Notes (Signed)
Careteam: Patient Care Team: Sharon Seller, NP as PCP - General (Geriatric Medicine)  Advanced Directive information Does Patient Have a Medical Advance Directive?: No, Would patient like information on creating a medical advance directive?: No - Patient declined  Allergies  Allergen Reactions  . Penicillins Rash    Chief Complaint  Patient presents with  . Follow-up    6-7 week follow-up on mood via telephone/video visit. Discuss need for colonoscopy, foot exam, PNA, and flu vaccine (not in [facility]) or exclude.      HPI: Patient is a 55 y.o. male for follow up on mood.  Reports he got buspar and did not feel like it made any difference. Reports mood swings have improved.  Anxiety better overall.  Eating better and walking more.   Reports he had colonoscopy in [place]  Had in 2017- so due this year. States he had several polyps follow up in 5 years     Review of Systems:  Review of Systems  Constitutional: Negative for chills, fever and weight loss.  HENT: Negative for tinnitus.   Respiratory: Negative for cough, sputum production and shortness of breath.   Cardiovascular: Negative for chest pain, palpitations and leg swelling.  Gastrointestinal: Negative for abdominal pain, constipation, diarrhea and heartburn.  Genitourinary: Negative for dysuria, frequency and urgency.  Musculoskeletal: Negative for back pain, falls, joint pain and myalgias.  Skin: Negative.   Neurological: Negative for dizziness and headaches.  Psychiatric/Behavioral: Negative for depression and memory loss. The patient is not nervous/anxious and does not have insomnia.     Past Medical History:  Diagnosis Date  . Anxiety   . Diabetes (HCC)   . High cholesterol   . Low testosterone in male   . Overweight    Past Surgical History:  Procedure Laterality Date  . COLONOSCOPY  2018  . None     Social History:   reports that he has been smoking cigarettes. He has a 15.00 pack-year  smoking history. He has never used smokeless tobacco. He reports previous alcohol use. He reports that he does not use drugs.  Family History  Problem Relation Age of Onset  . Stroke Mother   . Diabetes Mother   . Stroke Father     Medications: Patient's Medications  New Prescriptions   No medications on file  Previous Medications   ATORVASTATIN (LIPITOR) 40 MG TABLET    Take 40 mg by mouth daily.   DIPHENHYDRAMINE HCL, SLEEP, (SLEEP AID) 50 MG CAPS    Take 1 capsule by mouth at bedtime.   DULAGLUTIDE (TRULICITY) 0.75 MG/0.5ML SOPN    Inject 0.75 mg into the skin once a week.   LISINOPRIL (ZESTRIL) 5 MG TABLET    1 tablet daily for kidney protection due to diabetes   METFORMIN (GLUCOPHAGE) 1000 MG TABLET    Take 1,000 mg by mouth daily with breakfast.   SILDENAFIL (VIAGRA) 100 MG TABLET    Take 100 mg by mouth as needed for erectile dysfunction.   TESTOSTERONE CYPIONATE 200 MG/ML SOLN    Inject 200mg  total (24ml) into muscle every 7 days.  Modified Medications   No medications on file  Discontinued Medications   BUSPIRONE (BUSPAR) 5 MG TABLET    Take 1 tablet (5 mg total) by mouth 2 (two) times daily.    Physical Exam:  There were no vitals filed for this visit. There is no height or weight on file to calculate BMI. Wt Readings from Last 3 Encounters:  11/24/19 224 lb (101.6 kg)  06/23/19 233 lb (105.7 kg)      Labs reviewed: Basic Metabolic Panel: Recent Labs    06/23/19 0942 11/24/19 1039  NA 134* 135  K 4.8 4.2  CL 100 102  CO2 28 22  GLUCOSE 114* 101*  BUN 13 13  CREATININE 0.94 0.85  CALCIUM 9.5 9.5   Liver Function Tests: Recent Labs    06/23/19 0942 11/24/19 1039  AST 18 18  ALT 21 22  BILITOT 0.5 0.5  PROT 7.8 7.7   No results for input(s): LIPASE, AMYLASE in the last 8760 hours. No results for input(s): AMMONIA in the last 8760 hours. CBC: Recent Labs    06/23/19 0942 11/24/19 1039  WBC 7.3 8.3  NEUTROABS 4,460 5,520  HGB 16.2 16.3  HCT  50.3* 48.7  MCV 80.0 83.4  PLT 175 187   Lipid Panel: Recent Labs    06/23/19 0942 11/24/19 1039  CHOL 202* 162  HDL 27* 29*  LDLCALC 141* 168*  TRIG 198* 199*  CHOLHDL 7.5* 5.6*   TSH: No results for input(s): TSH in the last 8760 hours. A1C: Lab Results  Component Value Date   HGBA1C 5.7 (H) 11/24/2019     Assessment/Plan 1. Anxiety Overall improved. Stop buspar due to no improvement. Overall feels like symptoms controlled with lifestyle modifications. Continues to exercise and eat well and stress reduction techniques.   2. Mixed hyperlipidemia -continues on lipitor with lifestyle modifications. - Lipid Panel; Future - COMPLETE METABOLIC PANEL WITH GFR; Future  3. Type 2 diabetes mellitus with hyperglycemia, without long-term current use of insulin (HCC) -Encouraged dietary compliance, routine foot care/monitoring and to keep up with diabetic eye exams through ophthalmology  Continues on metformin daily with trulicity  - Hemoglobin A1c; Future  Next appt: 05/24/2020  Janene Harvey. Biagio Borg  [facility] [phone]    Virtual Visit via telephone (unable to connect via mychart)  I connected with patient on 03/01/20 at  9:00 AM EST by telephone and verified that I am speaking with the correct person using two identifiers.  Location: Patient: home Provider: [facility]   I discussed the limitations, risks, security and privacy concerns of performing an evaluation and management service by telephone and the availability of in person appointments. I also discussed with the patient that there may be a patient responsible charge related to this service. The patient expressed understanding and agreed to proceed.   I discussed the assessment and treatment plan with the patient. The patient was provided an opportunity to ask questions and all were answered. The patient agreed with the plan and demonstrated an understanding of the instructions.    The patient was advised to call back or seek an in-person evaluation if the symptoms worsen or if the condition fails to improve as anticipated.  I provided 15 minutes of non-face-to-face time during this encounter.  Janene Harvey. Biagio Borg Avs printed and mailed

## 2020-03-13 ENCOUNTER — Other Ambulatory Visit: Payer: Self-pay | Admitting: Nurse Practitioner

## 2020-03-18 ENCOUNTER — Other Ambulatory Visit: Payer: Self-pay | Admitting: Nurse Practitioner

## 2020-03-20 NOTE — Telephone Encounter (Signed)
Patient has request a refill on medication "Viagra 100 mg". Patient has requested 6 pills with 29 refills. Medication pend and sent to PCP Janyth Contes Janene Harvey, NP for approval.

## 2020-03-21 ENCOUNTER — Other Ambulatory Visit: Payer: Self-pay | Admitting: Nurse Practitioner

## 2020-03-21 NOTE — Telephone Encounter (Signed)
Patient called requesting refill on his medication.  Confirmed dosage. Atorvastatin 40mg  once daily  Pended Rx and sent to [facility] for approval due to HIGH ALERT Warning.

## 2020-04-06 ENCOUNTER — Ambulatory Visit (INDEPENDENT_AMBULATORY_CARE_PROVIDER_SITE_OTHER): Payer: BC Managed Care – PPO | Admitting: Family

## 2020-04-06 ENCOUNTER — Ambulatory Visit
Admission: RE | Admit: 2020-04-06 | Discharge: 2020-04-06 | Disposition: A | Discharge status: Home / Self Care | Payer: BC Managed Care – PPO | Source: Ambulatory Visit | Attending: Family | Admitting: Family

## 2020-04-06 ENCOUNTER — Other Ambulatory Visit: Payer: Self-pay

## 2020-04-06 ENCOUNTER — Encounter: Payer: Self-pay | Admitting: Family

## 2020-04-06 VITALS — BP 120/90 | HR 86 | Temp 97.7°F | Resp 16 | Ht 69.0 in | Wt 210.6 lb

## 2020-04-06 DIAGNOSIS — M546 Pain in thoracic spine: Secondary | ICD-10-CM

## 2020-04-06 DIAGNOSIS — E119 Type 2 diabetes mellitus without complications: Secondary | ICD-10-CM | POA: Diagnosis not present

## 2020-04-06 DIAGNOSIS — R059 Cough, unspecified: Secondary | ICD-10-CM | POA: Diagnosis not present

## 2020-04-06 DIAGNOSIS — Z23 Encounter for immunization: Secondary | ICD-10-CM | POA: Diagnosis not present

## 2020-04-06 MED ORDER — METFORMIN HCL 1000 MG PO TABS: 1000.0000 mg | ORAL_TABLET | Freq: Every day | ORAL | 1 refills | Status: DC

## 2020-04-06 MED ORDER — TETANUS-DIPHTH-ACELL PERTUSSIS 5-2.5-18.5 LF-MCG/0.5 IM SUSP: 0.5000 mL | Freq: Once | INTRAMUSCULAR | 0 refills | Status: AC
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
## 2020-04-06 NOTE — Patient Instructions (Signed)
-   Please chest X-ray done at [address] [place] at [facility] then will call you with results. - continue on Advil as needed if pain recurs  - Notify provider or go to ED if symptoms recurs

## 2020-05-10 ENCOUNTER — Other Ambulatory Visit: Payer: Self-pay | Admitting: Nurse Practitioner

## 2020-05-18 DIAGNOSIS — E291 Testicular hypofunction: Secondary | ICD-10-CM | POA: Diagnosis not present

## 2020-05-18 DIAGNOSIS — Z125 Encounter for screening for malignant neoplasm of prostate: Secondary | ICD-10-CM | POA: Diagnosis not present

## 2020-05-20 ENCOUNTER — Other Ambulatory Visit: Payer: Self-pay | Admitting: Nurse Practitioner

## 2020-05-20 DIAGNOSIS — E1165 Type 2 diabetes mellitus with hyperglycemia: Secondary | ICD-10-CM

## 2020-05-23 ENCOUNTER — Telehealth: Payer: Self-pay

## 2020-05-23 NOTE — Telephone Encounter (Signed)
Discussed with the patient. Future appointments rescheduled.

## 2020-05-23 NOTE — Telephone Encounter (Signed)
This is a routine follow up- we prefer to do these in office, likely will need blood work as well.

## 2020-05-23 NOTE — Telephone Encounter (Signed)
Patient called to confirm his appointment tomorrow if virtual. Advised the patient it is down to be in office. He says he was just here in February seeing Dinah and would prefer to do virtually. To Areatha Keas to advise.

## 2020-05-24 ENCOUNTER — Ambulatory Visit: Payer: BC Managed Care – PPO | Admitting: Nurse Practitioner

## 2020-05-30 ENCOUNTER — Other Ambulatory Visit: Payer: BC Managed Care – PPO

## 2020-06-01 ENCOUNTER — Other Ambulatory Visit: Payer: Self-pay

## 2020-06-01 ENCOUNTER — Other Ambulatory Visit: Payer: BC Managed Care – PPO

## 2020-06-01 DIAGNOSIS — E1165 Type 2 diabetes mellitus with hyperglycemia: Secondary | ICD-10-CM

## 2020-06-01 DIAGNOSIS — E782 Mixed hyperlipidemia: Secondary | ICD-10-CM | POA: Diagnosis not present

## 2020-06-02 DIAGNOSIS — N5201 Erectile dysfunction due to arterial insufficiency: Secondary | ICD-10-CM | POA: Diagnosis not present

## 2020-06-02 DIAGNOSIS — E291 Testicular hypofunction: Secondary | ICD-10-CM | POA: Diagnosis not present

## 2020-06-02 LAB — COMPLETE METABOLIC PANEL WITH GFR
AG Ratio: 1.4 (calc) (ref 1.0–2.5)
ALT: 25 U/L (ref 9–46)
AST: 18 U/L (ref 10–35)
Albumin: 4.4 g/dL (ref 3.6–5.1)
Alkaline phosphatase (APISO): 77 U/L (ref 35–144)
BUN: 21 mg/dL (ref 7–25)
CO2: 27 mmol/L (ref 20–32)
Calcium: 9.6 mg/dL (ref 8.6–10.3)
Chloride: 102 mmol/L (ref 98–110)
Creat: 1.02 mg/dL (ref 0.70–1.33)
GFR, Est African American: 97 mL/min/{1.73_m2} (ref >=60)
GFR, Est Non African American: 84 mL/min/{1.73_m2} (ref >=60)
Globulin: 3.1 g/dL (calc) (ref 1.9–3.7)
Glucose, Bld: 106 mg/dL — ABNORMAL HIGH (ref 65–99)
Potassium: 4.5 mmol/L (ref 3.5–5.3)
Sodium: 137 mmol/L (ref 135–146)
Total Bilirubin: 0.4 mg/dL (ref 0.2–1.2)
Total Protein: 7.5 g/dL (ref 6.1–8.1)

## 2020-06-02 LAB — LIPID PANEL
Cholesterol: 199 mg/dL (ref <200)
HDL: 28 mg/dL — ABNORMAL LOW (ref >=40)
LDL Cholesterol (Calc): 139 mg/dL (calc) — ABNORMAL HIGH
Non-HDL Cholesterol (Calc): 171 mg/dL (calc) — ABNORMAL HIGH (ref <130)
Total CHOL/HDL Ratio: 7.1 (calc) — ABNORMAL HIGH (ref <5.0)
Triglycerides: 188 mg/dL — ABNORMAL HIGH (ref <150)

## 2020-06-02 LAB — HEMOGLOBIN A1C
Hgb A1c MFr Bld: 5.7 % of total Hgb — ABNORMAL HIGH (ref <5.7)
Mean Plasma Glucose: 117 mg/dL
eAG (mmol/L): 6.5 mmol/L

## 2020-06-08 ENCOUNTER — Ambulatory Visit (INDEPENDENT_AMBULATORY_CARE_PROVIDER_SITE_OTHER): Payer: PRIVATE HEALTH INSURANCE | Admitting: Adult Health

## 2020-06-08 ENCOUNTER — Other Ambulatory Visit: Payer: Self-pay

## 2020-06-08 ENCOUNTER — Encounter: Payer: Self-pay | Admitting: Adult Health

## 2020-06-08 VITALS — BP 131/71 | HR 123 | Ht 68.0 in | Wt 200.0 lb

## 2020-06-08 DIAGNOSIS — F331 Major depressive disorder, recurrent, moderate: Secondary | ICD-10-CM

## 2020-06-08 DIAGNOSIS — F411 Generalized anxiety disorder: Secondary | ICD-10-CM | POA: Diagnosis not present

## 2020-06-08 MED ORDER — ROSUVASTATIN CALCIUM 40 MG PO TABS: 40.0000 mg | ORAL_TABLET | Freq: Every day | ORAL | 3 refills | Status: DC

## 2020-06-08 NOTE — Progress Notes (Signed)
[facility] MD/PA/NP Initial Note  06/09/2020 10:14 AM James Woods  MRN:  [id number]  Chief Complaint:   HPI:   Describes mood today as "ok". Pleasant. Denies tearfulness. Mood symptoms - reports history of depression and anxiety. Denies irritability. Stating "the only challenge I feel sometimes is anxiety". Notes wife is concerned about him and sent a lit for him to discuss. Has seen a psychiatrist in the past for depressive symptoms. Reports sexual issues with previous medications and does not want anything that will effect libido. Is willing to try medication for mood management. Reports being concerned about wife. Has 2 daughters that live independent from he and wife - but he continues to support them. 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 Stating "I have worked my whole life" - 7 days a week. Now feeling financially stable. Feels tired from working so hard. Concerned about wife. Has attended therapy. Stable interest and motivation. Willing to trial medication.  Wife has concerns about: Anxiety Paranoia Not enough concentration Rushing Not happy  Mood changes Decreased motivation in personal life.  Energy levels stable. Active, does not have a regular exercise routine. Walking 6 to 8 miles a day. Enjoys some usual interests and activities. Married. Lives with wife. Has 2 grown children - daughters 36 and 38. Spending time with family. Appetite adequate. Weight loss - 260 to 200 - A1C 5.7.  Sleeps well most nights. Averages hours. Focus and concentration stable. Completing tasks. Managing aspects of household. Works full-time Works at [employer]. Denies SI or HI.  Denies AH or VH. Denies substance use.   Previous medication trials: Wellbutrin, Celexa, Lexapro, Clonazepam, Abilify  Visit Diagnosis:    ICD-10-CM   1. Major depressive disorder, recurrent episode, moderate (HCC)  F33.1   2. Generalized anxiety disorder  F41.1     Past  Psychiatric History:  Has seen Psychiatrists in the past. Denies psychiatric hospitalization.  Past Medical History:  Past Medical History:  Diagnosis Date  . Anxiety   . Diabetes (HCC)   . High cholesterol   . Low testosterone in male   . Overweight     Past Surgical History:  Procedure Laterality Date  . COLONOSCOPY  2018  . None      Family Psychiatric History: Denies any family history of mental illness.  Family History:  Family History  Problem Relation Age of Onset  . Stroke Mother   . Diabetes Mother   . Stroke Father     Social History:  Social History   Socioeconomic History  . Marital status: Married    Spouse name: Not on file  . Number of children: Not on file  . Years of education: Not on file  . Highest education level: Not on file  Occupational History  . Not on file  Tobacco Use  . Smoking status: Current Every Day Smoker    Packs/day: 0.50    Years: 30.00    Pack years: 15.00    Types: Cigarettes  . Smokeless tobacco: Never Used  Vaping Use  . Vaping Use: Never used  Substance and Sexual Activity  . Alcohol use: Not Currently    Comment:  every 6 months will have a beer  . Drug use: Never  . Sexual activity: Not on file  Other Topics Concern  . Not on file  Social History Narrative   Tobacco use:  1/2 pack per day, 30 years   Alcohol use: None  Diet:     Do you drink/eat things with caffeine?  No.   Marital status: Married in 1993.    Do you live in a house, apartment, assisted living, condo, trailer, etc.)? House   Is it one or more stories? One story.   How many persons live in your home?  2   Do you have any pets in your home? (please list)  3 dogs   Highest level of education: - Some college.   Current or past profession:  Retail   Do you exercise:  No.       Advanced Directive   No Living Will, no DNR, no POA/HPOA      FUNCTIONAL STATUS   No difficulty with bathing, dressing, preparing food, eating, managing medications,  finances. No difficulty with affording medications.    Social Determinants of Health   Financial Resource Strain: Not on file  Food Insecurity: Not on file  Transportation Needs: Not on file  Physical Activity: Not on file  Stress: Not on file  Social Connections: Not on file    Allergies:  Allergies  Allergen Reactions  . Penicillins Rash    Metabolic Disorder Labs: Lab Results  Component Value Date   HGBA1C 5.7 (H) 06/01/2020   MPG 117 06/01/2020   MPG 117 11/24/2019   No results found for: PROLACTIN Lab Results  Component Value Date   CHOL 199 06/01/2020   TRIG 188 (H) 06/01/2020   HDL 28 (L) 06/01/2020   CHOLHDL 7.1 (H) 06/01/2020   LDLCALC 139 (H) 06/01/2020   LDLCALC 101 (H) 11/24/2019   No results found for: TSH  Therapeutic Level Labs: No results found for: LITHIUM No results found for: VALPROATE No components found for:  CBMZ  Current Medications: Current Outpatient Medications  Medication Sig Dispense Refill  . diphenhydrAMINE HCl, Sleep, (SLEEP AID) 50 MG CAPS Take 1 capsule by mouth at bedtime.    Marland Kitchen lisinopril (ZESTRIL) 5 MG tablet TAKE 1 TABLET BY MOUTH DAILY FOR KIDNEY PROTECTION DUE TO DIABETES 90 tablet 1  . metFORMIN (GLUCOPHAGE) 1000 MG tablet Take 1 tablet (1,000 mg total) by mouth daily with breakfast. 90 tablet 1  . rosuvastatin (CRESTOR) 40 MG tablet Take 1 tablet (40 mg total) by mouth daily. 90 tablet 3  . sildenafil (VIAGRA) 100 MG tablet TAKE 1 TABLET BY MOUTH AS NEEDED. FOR ERECTILE DYSFUNCTION. 6 tablet 6  . testosterone cypionate (DEPOTESTOTERONE CYPIONATE) 100 MG/ML injection Inject 100 mg into the muscle once a week.    . Testosterone Cypionate 200 MG/ML SOLN Inject 200mg  total (70ml) into muscle every 7 days.    . TRULICITY 0.75 MG/0.5ML SOPN INJECT 0.75 MG INTO THE SKIN ONCE A WEEK. 2 mL 11   No current facility-administered medications for this visit.    Medication Side Effects: none  Orders placed this visit:  No orders of  the defined types were placed in this encounter.   Psychiatric Specialty Exam:  Review of Systems  Musculoskeletal: Negative for gait problem.  Neurological: Negative for tremors.  Psychiatric/Behavioral: Agitation:        Please refer to HPI    Blood pressure 131/71, pulse (!) 123, height 5\' 8"  (1.727 m), weight 200 lb (90.7 kg).Body mass index is 30.41 kg/m.  General Appearance: Casual and Neat  Eye Contact:  Good  Speech:  Clear and Coherent and Normal Rate  Volume:  Normal  Mood:  Anxious and Depressed  Affect:  Appropriate and Congruent  Thought Process:  Coherent  and Descriptions of Associations: Intact  Orientation:  Full (Time, Place, and Person)  Thought Content: Logical   Suicidal Thoughts:  No  Homicidal Thoughts:  No  Memory:  WNL  Judgement:  Good  Insight:  Good  Psychomotor Activity:  Normal  Concentration:  Concentration: Good  Recall:  Good  Fund of Knowledge: Good  Language: Good  Assets:  Communication Skills Desire for Improvement Financial Resources/Insurance Housing Intimacy Leisure Time Physical Health Resilience Social Support Talents/Skills Transportation Vocational/Educational  ADL's:  Intact  Cognition: WNL  Prognosis:  Good   Screenings:  PHQ2-9   Flowsheet Row Office Visit from 12/24/2019 in [facility]  PHQ-2 Total Score 1  PHQ-9 Total Score 2      Receiving Psychotherapy: No   Treatment Plan/Recommendations:   Plan:  PDMP reviewed  1. Add Rexulti 0.5mg  daily for mood management - samples given.   Time spent with patient was 60 minutes. Greater than 50% of face to face time with patient was spent on counseling and coordination of care.    RTC 4 weeks  Patient advised to contact office with any questions, adverse effects, or acute worsening in signs and symptoms.      Dorothyann Gibbs, NP

## 2020-06-16 ENCOUNTER — Ambulatory Visit (INDEPENDENT_AMBULATORY_CARE_PROVIDER_SITE_OTHER): Payer: BC Managed Care – PPO | Admitting: Nurse Practitioner

## 2020-06-16 ENCOUNTER — Telehealth: Payer: Self-pay | Admitting: Adult Health

## 2020-06-16 ENCOUNTER — Encounter: Payer: Self-pay | Admitting: Nurse Practitioner

## 2020-06-16 ENCOUNTER — Other Ambulatory Visit: Payer: Self-pay

## 2020-06-16 VITALS — BP 120/79 | Temp 98.4°F | Ht 68.0 in | Wt 200.0 lb

## 2020-06-16 DIAGNOSIS — F324 Major depressive disorder, single episode, in partial remission: Secondary | ICD-10-CM | POA: Diagnosis not present

## 2020-06-16 DIAGNOSIS — R7989 Other specified abnormal findings of blood chemistry: Secondary | ICD-10-CM

## 2020-06-16 DIAGNOSIS — E782 Mixed hyperlipidemia: Secondary | ICD-10-CM

## 2020-06-16 DIAGNOSIS — E1165 Type 2 diabetes mellitus with hyperglycemia: Secondary | ICD-10-CM

## 2020-06-16 DIAGNOSIS — J302 Other seasonal allergic rhinitis: Secondary | ICD-10-CM | POA: Diagnosis not present

## 2020-06-16 NOTE — Telephone Encounter (Signed)
Pt informed

## 2020-06-16 NOTE — Telephone Encounter (Signed)
It will take 2 to 4 weeks for medication to work - started 8 days ago.

## 2020-06-16 NOTE — Telephone Encounter (Signed)
Pt stated he was taking them for mood management,and it has not helped.He is still angry and sad on some days.

## 2020-06-16 NOTE — Telephone Encounter (Signed)
Pt was given samples of rexulti at his last visit and he feels that they are not working. Please call [phone].

## 2020-06-16 NOTE — Progress Notes (Signed)
This service is provided via telemedicine  No vital signs collected/recorded due to the encounter was a telemedicine visit.   Location of patient (ex: home, work): Home  Patient consents to a telephone visit:  Yes, see encounter on 12/24/19  Location of the provider (ex: office, home):  [facility]   Name of any referring provider: N/A    Names of all persons participating in the telemedicine service and their role in the encounter: Abbey Chatters, NP, Bary Richard, CMA and patient.  Time spent on call: Spent 8 minutes with medical assistant.

## 2020-06-16 NOTE — Progress Notes (Signed)
Careteam: Patient Care Team: James Seller, NP as PCP - General (Geriatric Medicine)  Advanced Directive information Does Patient Have a Medical Advance Directive?: No, Would patient like information on creating a medical advance directive?: No - Patient declined  Allergies  Allergen Reactions  . Penicillins Rash    Chief Complaint  Patient presents with  . Medical Management of Chronic Issues    6 month follow up via telehealth.     HPI: Patient is a 55 y.o. male for routine follow up  Hyperlipidemia- not at goal. Was on Lipitor, admits he was not taking as prescribed.  Would only take for 2 weeks.   DM- metformin daily with trulicity, A1c at goal. No low blood sugars. Misses metformin occasionally.  Have not gotten to eye doctor.   Depression- went to psychiatrist. His wife wants him to talk to the psychiatrist more about the fact his mood is not stable and issues with happiness.  He was previously on abilify, lexapro, viibrdy.   ED/low testosterone being followed by urologist.   Review of Systems:  Review of Systems  Constitutional: Negative for chills, fever and weight loss.  HENT: Negative for tinnitus.   Respiratory: Negative for cough, sputum production and shortness of breath.   Cardiovascular: Negative for chest pain, palpitations and leg swelling.  Gastrointestinal: Negative for abdominal pain, constipation, diarrhea and heartburn.  Genitourinary: Negative for dysuria, frequency and urgency.  Musculoskeletal: Negative for back pain, joint pain and myalgias.  Skin: Negative.   Neurological: Negative for dizziness and headaches.  Endo/Heme/Allergies: Positive for environmental allergies.  Psychiatric/Behavioral: Negative for depression and memory loss. The patient does not have insomnia.     Past Medical History:  Diagnosis Date  . Anxiety   . Diabetes (HCC)   . High cholesterol   . Low testosterone in male   . Overweight    Past Surgical  History:  Procedure Laterality Date  . COLONOSCOPY  2018  . None     Social History:   reports that he has been smoking cigarettes. He has a 15.00 pack-year smoking history. He has never used smokeless tobacco. He reports previous alcohol use. He reports that he does not use drugs.  Family History  Problem Relation Age of Onset  . Stroke Mother   . Diabetes Mother   . Stroke Father     Medications: Patient's Medications  New Prescriptions   No medications on file  Previous Medications   DIPHENHYDRAMINE HCL, SLEEP, (SLEEP AID) 50 MG CAPS    Take 1 capsule by mouth at bedtime.   LISINOPRIL (ZESTRIL) 5 MG TABLET    TAKE 1 TABLET BY MOUTH DAILY FOR KIDNEY PROTECTION DUE TO DIABETES   METFORMIN (GLUCOPHAGE) 1000 MG TABLET    Take 1 tablet (1,000 mg total) by mouth daily with breakfast.   ROSUVASTATIN (CRESTOR) 40 MG TABLET    Take 1 tablet (40 mg total) by mouth daily.   SILDENAFIL (VIAGRA) 100 MG TABLET    TAKE 1 TABLET BY MOUTH AS NEEDED. FOR ERECTILE DYSFUNCTION.   TESTOSTERONE CYPIONATE (DEPOTESTOTERONE CYPIONATE) 100 MG/ML INJECTION    Inject 100 mg into the muscle once a week.   TRULICITY 0.75 MG/0.5ML SOPN    INJECT 0.75 MG INTO THE SKIN ONCE A WEEK.  Modified Medications   No medications on file  Discontinued Medications   TESTOSTERONE CYPIONATE 200 MG/ML SOLN    Inject 200mg  total (52ml) into muscle every 7 days.    Physical Exam:  Vitals:   06/16/20 1449  BP: 120/79  Temp: 98.4 F (36.9 C)  Weight: 200 lb (90.7 kg)  Height: 5\' 8"  (1.727 m)   Body mass index is 30.41 kg/m. Wt Readings from Last 3 Encounters:  06/16/20 200 lb (90.7 kg)  04/06/20 210 lb 9.6 oz (95.5 kg)  11/24/19 224 lb (101.6 kg)      Labs reviewed: Basic Metabolic Panel: Recent Labs    06/23/19 0942 11/24/19 1039 06/01/20 0813  NA 134* 135 137  K 4.8 4.2 4.5  CL 100 102 102  CO2 28 22 27   GLUCOSE 114* 101* 106*  BUN 13 13 21   CREATININE 0.94 0.85 1.02  CALCIUM 9.5 9.5 9.6    Liver Function Tests: Recent Labs    06/23/19 0942 11/24/19 1039 06/01/20 0813  AST 18 18 18   ALT 21 22 25   BILITOT 0.5 0.5 0.4  PROT 7.8 7.7 7.5   No results for input(s): LIPASE, AMYLASE in the last 8760 hours. No results for input(s): AMMONIA in the last 8760 hours. CBC: Recent Labs    06/23/19 0942 11/24/19 1039  WBC 7.3 8.3  NEUTROABS 4,460 5,520  HGB 16.2 16.3  HCT 50.3* 48.7  MCV 80.0 83.4  PLT 175 187   Lipid Panel: Recent Labs    06/23/19 0942 11/24/19 1039 06/01/20 0813  CHOL 202* 162 199  HDL 27* 29* 28*  LDLCALC 141* 101* 139*  TRIG 198* 199* 188*  CHOLHDL 7.5* 5.6* 7.1*   TSH: No results for input(s): TSH in the last 8760 hours. A1C: Lab Results  Component Value Date   HGBA1C 5.7 (H) 06/01/2020     Assessment/Plan 1. Type 2 diabetes mellitus with hyperglycemia, without long-term current use of insulin (HCC) -Encouraged dietary compliance, routine foot care/monitoring and to keep up with diabetic eye exams through ophthalmology  -continues on trulicity and metformin, A1c at goal.  - Ambulatory referral to Ophthalmology - COMPLETE METABOLIC PANEL WITH GFR; Future - Hemoglobin A1c; Future - CBC with Differential/Platelet; Future - Lipid panel; Future  2. Depression, major, single episode, in partial remission (HCC) Ongoing, following with psychiatrist who is managing medication regimen at this time  3. Seasonal allergies Stable, can use claritin or zyrtec 10 mg OTC daily for symptoms management.   4. Mixed hyperlipidemia -continues on crestor 40 mg daily- he had not been taking medication routinely previously.  - Lipid Panel; Future - COMPLETE METABOLIC PANEL WITH GFR; Future  5. Low testosterone Followed by urology at this time.   Next appt: will need follow up cmp and lipids in 2 months Then to follow up in 6 months for routine follow up, fasting labs prior to appt.  11/26/19. 08/23/19  [facility] [phone]    Virtual Visit via 06/03/2020  I connected with patient on 06/16/20 at  3:45 PM EDT by video visit and verified that I am speaking with the correct person using two identifiers.  Location: Patient: home Provider: [facility]   I discussed the limitations, risks, security and privacy concerns of performing an evaluation and management service by telephone and the availability of in person appointments. I also discussed with the patient that there may be a patient responsible charge related to this service. The patient expressed understanding and agreed to proceed.   I discussed the assessment and treatment plan with the patient. The patient was provided an opportunity to ask questions and all were answered. The patient agreed with the plan  and demonstrated an understanding of the instructions.   The patient was advised to call back or seek an in-person evaluation if the symptoms worsen or if the condition fails to improve as anticipated.  I provided 25 minutes of non-face-to-face time during this encounter.  Janene Harvey. [employer] printed and mailed

## 2020-07-06 ENCOUNTER — Encounter: Payer: Self-pay | Admitting: Adult Health

## 2020-07-06 ENCOUNTER — Telehealth (INDEPENDENT_AMBULATORY_CARE_PROVIDER_SITE_OTHER): Payer: PRIVATE HEALTH INSURANCE | Admitting: Adult Health

## 2020-07-06 DIAGNOSIS — F411 Generalized anxiety disorder: Secondary | ICD-10-CM

## 2020-07-06 DIAGNOSIS — F331 Major depressive disorder, recurrent, moderate: Secondary | ICD-10-CM

## 2020-07-06 MED ORDER — DIAZEPAM 5 MG PO TABS: 5.0000 mg | ORAL_TABLET | Freq: Two times a day (BID) | ORAL | 2 refills | Status: DC | PRN

## 2020-07-06 MED ORDER — DESVENLAFAXINE SUCCINATE ER 50 MG PO TB24: 50.0000 mg | ORAL_TABLET | Freq: Every day | ORAL | 2 refills | Status: DC

## 2020-07-06 NOTE — Progress Notes (Signed)
James Woods [id number] 12/02/1965 55 y.o.  Virtual Visit via Telephone Note  I connected with pt on 07/06/20 at  4:20 PM EDT by telephone and verified that I am speaking with the correct person using two identifiers.   I discussed the limitations, risks, security and privacy concerns of performing an evaluation and management service by telephone and the availability of in person appointments. I also discussed with the patient that there may be a patient responsible charge related to this service. The patient expressed understanding and agreed to proceed.   I discussed the assessment and treatment plan with the patient. The patient was provided an opportunity to ask questions and all were answered. The patient agreed with the plan and demonstrated an understanding of the instructions.   The patient was advised to call back or seek an in-person evaluation if the symptoms worsen or if the condition fails to improve as anticipated.  I provided 30 minutes of non-face-to-face time during this encounter.  The patient was located at home.  The provider was located at [facility].   Dorothyann Gibbs, NP   Subjective:   Patient ID:  James Woods is a 55 y.o. (DOB September 10, 1965) male.  Chief Complaint: No chief complaint on file.   HPI James Woods presents for follow-up of MDD and GAD.   Describes mood today as "ok". Pleasant. Denies tearfulness. Mood symptoms - reports depression and anxiety. Denies irritability. Stating "I've been more anxious and tense". Increase stress in work setting. Tried the Rexulti, but did not have any change in mood symptoms. Stating "I didn't see any benefit from the medication". Is willing to try another medication for mood management. Spending time with wife. Stable interest and motivation. Willing to trial medication. Energy levels stable. Active, does not have a regular exercise routine. Walking 6 to 8 miles a day. Enjoys some usual interests and  activities. Married. Lives with wife. Has 2 grown children - daughters 77 and 73. Spending time with family. Appetite adequate. Weight gain - 5 pounds - 190 to 195 pounds. Sleeps well most nights. Averages 7 hours. Focus and concentration stable. Completing tasks. Managing aspects of household. Works full-time Works at [employer]. Denies SI or HI.  Denies AH or VH. Denies substance use.    Previous medication trials: Wellbutrin, Celexa, Lexapro, Clonazepam, Abilify, Rexulti  Review of Systems:  Review of Systems  Musculoskeletal: Negative for gait problem.  Neurological: Negative for tremors.  Psychiatric/Behavioral:       Please refer to HPI    Medications: I have reviewed the patient's current medications.  Current Outpatient Medications  Medication Sig Dispense Refill  . desvenlafaxine (PRISTIQ) 50 MG 24 hr tablet Take 1 tablet (50 mg total) by mouth daily. 30 tablet 2  . diazepam (VALIUM) 5 MG tablet Take 1 tablet (5 mg total) by mouth 2 (two) times daily as needed for anxiety. 60 tablet 2  . diphenhydrAMINE HCl, Sleep, (SLEEP AID) 50 MG CAPS Take 1 capsule by mouth at bedtime.    Marland Kitchen lisinopril (ZESTRIL) 5 MG tablet TAKE 1 TABLET BY MOUTH DAILY FOR KIDNEY PROTECTION DUE TO DIABETES 90 tablet 1  . metFORMIN (GLUCOPHAGE) 1000 MG tablet Take 1 tablet (1,000 mg total) by mouth daily with breakfast. 90 tablet 1  . rosuvastatin (CRESTOR) 40 MG tablet Take 1 tablet (40 mg total) by mouth daily. 90 tablet 3  . sildenafil (VIAGRA) 100 MG tablet TAKE 1 TABLET BY MOUTH AS NEEDED. FOR ERECTILE DYSFUNCTION. 6 tablet 6  .  testosterone cypionate (DEPOTESTOTERONE CYPIONATE) 100 MG/ML injection Inject 100 mg into the muscle once a week.    . TRULICITY 0.75 MG/0.5ML SOPN INJECT 0.75 MG INTO THE SKIN ONCE A WEEK. 2 mL 11   No current facility-administered medications for this visit.    Medication Side Effects: None  Allergies:  Allergies  Allergen Reactions  . Penicillins Rash     Past Medical History:  Diagnosis Date  . Anxiety   . Diabetes (HCC)   . High cholesterol   . Low testosterone in male   . Overweight     Family History  Problem Relation Age of Onset  . Stroke Mother   . Diabetes Mother   . Stroke Father     Social History   Socioeconomic History  . Marital status: Married    Spouse name: Not on file  . Number of children: Not on file  . Years of education: Not on file  . Highest education level: Not on file  Occupational History  . Not on file  Tobacco Use  . Smoking status: Current Every Day Smoker    Packs/day: 0.50    Years: 30.00    Pack years: 15.00    Types: Cigarettes  . Smokeless tobacco: Never Used  Vaping Use  . Vaping Use: Never used  Substance and Sexual Activity  . Alcohol use: Not Currently    Comment:  every 6 months will have a beer  . Drug use: Never  . Sexual activity: Not on file  Other Topics Concern  . Not on file  Social History Narrative   Tobacco use:  1/2 pack per day, 30 years   Alcohol use: None   Diet:     Do you drink/eat things with caffeine?  No.   Marital status: Married in 1993.    Do you live in a house, apartment, assisted living, condo, trailer, etc.)? House   Is it one or more stories? One story.   How many persons live in your home?  2   Do you have any pets in your home? (please list)  3 dogs   Highest level of education: - Some college.   Current or past profession:  Retail   Do you exercise:  No.       Advanced Directive   No Living Will, no DNR, no POA/HPOA      FUNCTIONAL STATUS   No difficulty with bathing, dressing, preparing food, eating, managing medications, finances. No difficulty with affording medications.    Social Determinants of Health   Financial Resource Strain: Not on file  Food Insecurity: Not on file  Transportation Needs: Not on file  Physical Activity: Not on file  Stress: Not on file  Social Connections: Not on file  Intimate Partner Violence:  Not on file    Past Medical History, Surgical history, Social history, and Family history were reviewed and updated as appropriate.   Please see review of systems for further details on the patient's review from today.   Objective:   Physical Exam:  There were no vitals taken for this visit.  Physical Exam Neurological:     Mental Status: He is alert and oriented to person, place, and time.     Cranial Nerves: No dysarthria.  Psychiatric:        Attention and Perception: Attention and perception normal.        Mood and Affect: Mood normal.        Speech: Speech normal.  Behavior: Behavior is cooperative.        Thought Content: Thought content normal. Thought content is not paranoid or delusional. Thought content does not include homicidal or suicidal ideation. Thought content does not include homicidal or suicidal plan.        Cognition and Memory: Cognition and memory normal.        Judgment: Judgment normal.     Comments: Insight intact     Lab Review:     Component Value Date/Time   NA 137 06/01/2020 0813   K 4.5 06/01/2020 0813   CL 102 06/01/2020 0813   CO2 27 06/01/2020 0813   GLUCOSE 106 (H) 06/01/2020 0813   BUN 21 06/01/2020 0813   CREATININE 1.02 06/01/2020 0813   CALCIUM 9.6 06/01/2020 0813   PROT 7.5 06/01/2020 0813   AST 18 06/01/2020 0813   ALT 25 06/01/2020 0813   BILITOT 0.4 06/01/2020 0813   GFRNONAA 84 06/01/2020 0813   GFRAA 97 06/01/2020 0813       Component Value Date/Time   WBC 8.3 11/24/2019 1039   RBC 5.84 (H) 11/24/2019 1039   HGB 16.3 11/24/2019 1039   HCT 48.7 11/24/2019 1039   PLT 187 11/24/2019 1039   MCV 83.4 11/24/2019 1039   MCH 27.9 11/24/2019 1039   MCHC 33.5 11/24/2019 1039   RDW 13.5 11/24/2019 1039   LYMPHSABS 1,975 11/24/2019 1039   EOSABS 282 11/24/2019 1039   BASOSABS 91 11/24/2019 1039    No results found for: POCLITH, LITHIUM   No results found for: PHENYTOIN, PHENOBARB, VALPROATE, CBMZ    .res Assessment: Plan:    Plan:  PDMP reviewed  1. D/C Rexulti 0.5mg  daily for mood management - samples given. 2. Add Valium 5mg  twice daily - taken previously. 3. Add Pristiq 50mg  daily    RTC 4 weeks  Patient advised to contact office with any questions, adverse effects, or acute worsening in signs and symptoms.  Discussed potential benefits, risk, and side effects of benzodiazepines to include potential risk of tolerance and dependence, as well as possible drowsiness.  Advised patient not to drive if experiencing drowsiness and to take lowest possible effective dose to minimize risk of dependence and tolerance.  Diagnoses and all orders for this visit:  Generalized anxiety disorder -     diazepam (VALIUM) 5 MG tablet; Take 1 tablet (5 mg total) by mouth 2 (two) times daily as needed for anxiety. -     desvenlafaxine (PRISTIQ) 50 MG 24 hr tablet; Take 1 tablet (50 mg total) by mouth daily.  Major depressive disorder, recurrent episode, moderate (HCC) -     diazepam (VALIUM) 5 MG tablet; Take 1 tablet (5 mg total) by mouth 2 (two) times daily as needed for anxiety. -     desvenlafaxine (PRISTIQ) 50 MG 24 hr tablet; Take 1 tablet (50 mg total) by mouth daily.    Please see After Visit Summary for patient specific instructions.  No future appointments.  No orders of the defined types were placed in this encounter.     -------------------------------

## 2020-07-28 ENCOUNTER — Other Ambulatory Visit: Payer: Self-pay | Admitting: Adult Health

## 2020-07-28 DIAGNOSIS — F331 Major depressive disorder, recurrent, moderate: Secondary | ICD-10-CM

## 2020-07-28 DIAGNOSIS — F411 Generalized anxiety disorder: Secondary | ICD-10-CM

## 2020-08-24 DIAGNOSIS — L821 Other seborrheic keratosis: Secondary | ICD-10-CM | POA: Diagnosis not present

## 2020-08-24 DIAGNOSIS — L918 Other hypertrophic disorders of the skin: Secondary | ICD-10-CM | POA: Diagnosis not present

## 2020-08-24 DIAGNOSIS — B078 Other viral warts: Secondary | ICD-10-CM | POA: Diagnosis not present

## 2020-08-24 DIAGNOSIS — D171 Benign lipomatous neoplasm of skin and subcutaneous tissue of trunk: Secondary | ICD-10-CM | POA: Diagnosis not present

## 2020-10-03 ENCOUNTER — Other Ambulatory Visit: Payer: Self-pay

## 2020-10-03 ENCOUNTER — Ambulatory Visit (INDEPENDENT_AMBULATORY_CARE_PROVIDER_SITE_OTHER): Payer: BC Managed Care – PPO | Admitting: Podiatry

## 2020-10-03 DIAGNOSIS — B351 Tinea unguium: Secondary | ICD-10-CM | POA: Diagnosis not present

## 2020-10-03 DIAGNOSIS — E1165 Type 2 diabetes mellitus with hyperglycemia: Secondary | ICD-10-CM

## 2020-10-03 MED ORDER — FLUCONAZOLE 150 MG PO TABS: 150.0000 mg | ORAL_TABLET | ORAL | 2 refills | Status: DC

## 2020-10-03 NOTE — Progress Notes (Signed)
  Subjective:  Patient ID: James Woods, male    DOB: [birth year],  MRN: [id number]  Chief Complaint  Patient presents with   Nail Problem    Nail/callus trim Diabetic foot care   55 y.o. male presents with the above complaint. History confirmed with patient. Did not take the tablet he was prescribed but would like to start them up again.  Objective:  Physical Exam: warm, good capillary refill, nail exam dystrophic thickened nails right foot, left foot without dystrophy, no trophic changes or ulcerative lesions. DP pulses palpable, PT pulses palpable and protective sensation intact Left Foot: normal exam, no swelling, tenderness, instability; ligaments intact, full range of motion of all ankle/foot joints  Right Foot: normal exam, no swelling, tenderness, instability; ligaments intact, full range of motion of all ankle/foot joints    HPKs bilat heels, left 5th toe, left hallux  No images are attached to the encounter.  Assessment:   1. Type 2 diabetes mellitus with hyperglycemia, without long-term current use of insulin (HCC)   2. Onychomycosis    Plan:  Patient was evaluated and treated and all questions answered.  DM without complication -Nails debrided x10 -Restart fluconazole  Return in about 3 months (around 01/03/2021) for Nail Fungus.

## 2020-10-24 ENCOUNTER — Other Ambulatory Visit: Payer: Self-pay | Admitting: Adult Health

## 2020-10-24 DIAGNOSIS — F331 Major depressive disorder, recurrent, moderate: Secondary | ICD-10-CM

## 2020-10-24 DIAGNOSIS — F411 Generalized anxiety disorder: Secondary | ICD-10-CM

## 2020-10-27 NOTE — Telephone Encounter (Signed)
Last apt 06/2020 was due back 4 weeks

## 2020-11-01 ENCOUNTER — Other Ambulatory Visit: Payer: Self-pay

## 2020-11-01 ENCOUNTER — Other Ambulatory Visit: Payer: BC Managed Care – PPO

## 2020-11-01 ENCOUNTER — Ambulatory Visit: Payer: BC Managed Care – PPO | Admitting: Nurse Practitioner

## 2020-11-01 ENCOUNTER — Encounter: Payer: Self-pay | Admitting: Nurse Practitioner

## 2020-11-01 VITALS — BP 118/78 | HR 79 | Temp 97.1°F | Ht 68.0 in | Wt 191.4 lb

## 2020-11-01 DIAGNOSIS — F324 Major depressive disorder, single episode, in partial remission: Secondary | ICD-10-CM | POA: Diagnosis not present

## 2020-11-01 DIAGNOSIS — R7989 Other specified abnormal findings of blood chemistry: Secondary | ICD-10-CM

## 2020-11-01 DIAGNOSIS — Z23 Encounter for immunization: Secondary | ICD-10-CM

## 2020-11-01 DIAGNOSIS — E782 Mixed hyperlipidemia: Secondary | ICD-10-CM

## 2020-11-01 DIAGNOSIS — E1165 Type 2 diabetes mellitus with hyperglycemia: Secondary | ICD-10-CM

## 2020-11-01 MED ORDER — ROSUVASTATIN CALCIUM 40 MG PO TABS: 40.0000 mg | ORAL_TABLET | Freq: Every day | ORAL | 3 refills | Status: DC

## 2020-11-01 MED ORDER — LISINOPRIL 5 MG PO TABS: ORAL_TABLET | ORAL | 1 refills | Status: DC

## 2020-11-01 NOTE — Progress Notes (Signed)
Careteam: Patient Care Team: Sharon Seller, NP as PCP - General (Geriatric Medicine)  PLACE OF SERVICE:  [facility]  Advanced Directive information    Allergies  Allergen Reactions   Penicillins Rash    Chief Complaint  Patient presents with   Medical Management of Chronic Issues    Routine follow-up. Discuss need for td/tdap, shingrix, covid, and eye exam or exclude. Foot exam today. Flu vaccine today. Takes metformin, crestor, and lisinopril 1-2 times a week.      HPI: Patient is a 55 y.o. male for routine follow up Due for labs. Fasting today.   He is taking fluconazole weekly due to fungus in nail. On his 2nd month of treatment- plans to take for 3 months. Working well.   DM- does not check blood sugars. No low or high blood sugars.  Does not take his metformin  Does not want it off medication list.  He is taking trulicity once weekly.  Focusing a lot on eating better and walking routinely.   Hyperlipidemia- on Crestor but not taking.   Smoking less, 1 cigarette a week.   Review of Systems:  Review of Systems  Constitutional:  Negative for chills, fever and weight loss.  HENT:  Negative for tinnitus.   Respiratory:  Negative for cough, sputum production and shortness of breath.   Cardiovascular:  Negative for chest pain, palpitations and leg swelling.  Gastrointestinal:  Negative for abdominal pain, constipation, diarrhea and heartburn.  Genitourinary:  Negative for dysuria, frequency and urgency.  Musculoskeletal:  Negative for back pain, falls, joint pain and myalgias.  Skin: Negative.   Neurological:  Negative for dizziness and headaches.  Endo/Heme/Allergies:  Does not bruise/bleed easily.  Psychiatric/Behavioral:  Negative for depression and memory loss. The patient does not have insomnia.    Past Medical History:  Diagnosis Date   Anxiety    Diabetes (HCC)    High cholesterol    Low testosterone in male    Overweight    Past Surgical  History:  Procedure Laterality Date   COLONOSCOPY  2018   None     Social History:   reports that he has been smoking cigarettes. He has a 15.00 pack-year smoking history. He has never used smokeless tobacco. He reports that he does not currently use alcohol. He reports that he does not use drugs.  Family History  Problem Relation Age of Onset   Stroke Mother    Diabetes Mother    Stroke Father     Medications: Patient's Medications  New Prescriptions   No medications on file  Previous Medications   DIPHENHYDRAMINE HCL, SLEEP, (SLEEP AID) 50 MG CAPS    Take 1 capsule by mouth at bedtime.   FLUCONAZOLE (DIFLUCAN) 150 MG TABLET    Take 1 tablet (150 mg total) by mouth once a week.   LISINOPRIL (ZESTRIL) 5 MG TABLET    TAKE 1 TABLET BY MOUTH DAILY FOR KIDNEY PROTECTION DUE TO DIABETES   METFORMIN (GLUCOPHAGE) 1000 MG TABLET    Take 1 tablet (1,000 mg total) by mouth daily with breakfast.   ROSUVASTATIN (CRESTOR) 40 MG TABLET    Take 1 tablet (40 mg total) by mouth daily.   SILDENAFIL (VIAGRA) 100 MG TABLET    TAKE 1 TABLET BY MOUTH AS NEEDED. FOR ERECTILE DYSFUNCTION.   TESTOSTERONE CYPIONATE (DEPOTESTOTERONE CYPIONATE) 100 MG/ML INJECTION    Inject 100 mg into the muscle once a week.   TRULICITY 0.75 MG/0.5ML SOPN  INJECT 0.75 MG INTO THE SKIN ONCE A WEEK.  Modified Medications   No medications on file  Discontinued Medications   DESVENLAFAXINE (PRISTIQ) 50 MG 24 HR TABLET    TAKE 1 TABLET BY MOUTH EVERY DAY   DIAZEPAM (VALIUM) 5 MG TABLET    Take 1 tablet (5 mg total) by mouth 2 (two) times daily as needed for anxiety.    Physical Exam:  Vitals:   11/01/20 1027  BP: 118/78  Pulse: 79  Temp: (!) 97.1 F (36.2 C)  TempSrc: Temporal  SpO2: 98%  Weight: 191 lb 6.4 oz (86.8 kg)  Height: 5\' 8"  (1.727 m)   Body mass index is 29.1 kg/m. Wt Readings from Last 3 Encounters:  11/01/20 191 lb 6.4 oz (86.8 kg)  06/16/20 200 lb (90.7 kg)  04/06/20 210 lb 9.6 oz (95.5 kg)     Physical Exam Constitutional:      General: He is not in acute distress.    Appearance: He is well-developed. He is not diaphoretic.  HENT:     Head: Normocephalic and atraumatic.     Right Ear: External ear normal.     Left Ear: External ear normal.     Mouth/Throat:     Pharynx: No oropharyngeal exudate.  Eyes:     Conjunctiva/sclera: Conjunctivae normal.     Pupils: Pupils are equal, round, and reactive to light.  Cardiovascular:     Rate and Rhythm: Normal rate and regular rhythm.     Heart sounds: Normal heart sounds.  Pulmonary:     Effort: Pulmonary effort is normal.     Breath sounds: Normal breath sounds.  Abdominal:     General: Bowel sounds are normal.     Palpations: Abdomen is soft.  Musculoskeletal:        General: No tenderness.     Cervical back: Normal range of motion and neck supple.     Right lower leg: No edema.     Left lower leg: No edema.  Skin:    General: Skin is warm and dry.  Neurological:     Mental Status: He is alert and oriented to person, place, and time.    Labs reviewed: Basic Metabolic Panel: Recent Labs    11/24/19 1039 06/01/20 0813  NA 135 137  K 4.2 4.5  CL 102 102  CO2 22 27  GLUCOSE 101* 106*  BUN 13 21  CREATININE 0.85 1.02  CALCIUM 9.5 9.6   Liver Function Tests: Recent Labs    11/24/19 1039 06/01/20 0813  AST 18 18  ALT 22 25  BILITOT 0.5 0.4  PROT 7.7 7.5   No results for input(s): LIPASE, AMYLASE in the last 8760 hours. No results for input(s): AMMONIA in the last 8760 hours. CBC: Recent Labs    11/24/19 1039  WBC 8.3  NEUTROABS 5,520  HGB 16.3  HCT 48.7  MCV 83.4  PLT 187   Lipid Panel: Recent Labs    11/24/19 1039 06/01/20 0813  CHOL 162 199  HDL 29* 28*  LDLCALC 101* 06/03/20*  TRIG 199* 188*  CHOLHDL 5.6* 7.1*   TSH: No results for input(s): TSH in the last 8760 hours. A1C: Lab Results  Component Value Date   HGBA1C 5.7 (H) 06/01/2020     Assessment/Plan 1. Type 2 diabetes  mellitus with hyperglycemia, without long-term current use of insulin (HCC) -Encouraged dietary compliance, routine foot care/monitoring and to keep up with diabetic eye exams through ophthalmology  -he is not  taking metformin consistently. Taking trulicity as prescribed. Will follow up A1c, consider increase in trulicity and stopping metformin if A1c not at goal.   - Flu Vaccine QUAD High Dose(Fluad) - lisinopril (ZESTRIL) 5 MG tablet; TAKE 1 TABLET BY MOUTH DAILY FOR KIDNEY PROTECTION DUE TO DIABETES  Dispense: 90 tablet; Refill: 1  2. Need for influenza vaccination - Flu Vaccine QUAD High Dose(Fluad)  3. Mixed hyperlipidemia -not taking Crestor consisently, education on importance of keeping LDL <70 to reduce risk of heart attack and stroke. Increase risk due to diabetes.  - rosuvastatin (CRESTOR) 40 MG tablet; Take 1 tablet (40 mg total) by mouth daily.  Dispense: 90 tablet; Refill: 3  4. Depression, major, single episode, in partial remission (HCC) Stable, controlled off medication.   5. Low testosterone -on supplements.    Next appt: 6 months.  Janene Harvey. Biagio Borg  [facility] [phone]

## 2020-11-01 NOTE — Patient Instructions (Addendum)
To sign record release for ophthalmology (dr Dione Booze)   Would recommend that you take lisinopril and Crestor daily   Tdap and shingles vaccine recommended at your local pharmacy

## 2020-11-02 ENCOUNTER — Other Ambulatory Visit: Payer: BC Managed Care – PPO

## 2020-11-02 ENCOUNTER — Other Ambulatory Visit: Payer: Self-pay

## 2020-11-02 DIAGNOSIS — E782 Mixed hyperlipidemia: Secondary | ICD-10-CM

## 2020-11-02 DIAGNOSIS — E1165 Type 2 diabetes mellitus with hyperglycemia: Secondary | ICD-10-CM | POA: Diagnosis not present

## 2020-11-02 LAB — COMPLETE METABOLIC PANEL WITH GFR
AG Ratio: 1.4 (calc) (ref 1.0–2.5)
ALT: 13 U/L (ref 9–46)
AST: 15 U/L (ref 10–35)
Albumin: 4.6 g/dL (ref 3.6–5.1)
Alkaline phosphatase (APISO): 59 U/L (ref 35–144)
BUN: 19 mg/dL (ref 7–25)
CO2: 24 mmol/L (ref 20–32)
Calcium: 9.4 mg/dL (ref 8.6–10.3)
Chloride: 102 mmol/L (ref 98–110)
Creat: 0.89 mg/dL (ref 0.70–1.30)
Globulin: 3.2 g/dL (calc) (ref 1.9–3.7)
Glucose, Bld: 89 mg/dL (ref 65–99)
Potassium: 4.4 mmol/L (ref 3.5–5.3)
Sodium: 136 mmol/L (ref 135–146)
Total Bilirubin: 0.8 mg/dL (ref 0.2–1.2)
Total Protein: 7.8 g/dL (ref 6.1–8.1)
eGFR: 102 mL/min/{1.73_m2} (ref >=60)

## 2020-11-02 LAB — LIPID PANEL
Cholesterol: 234 mg/dL — ABNORMAL HIGH (ref <200)
HDL: 32 mg/dL — ABNORMAL LOW (ref >=40)
LDL Cholesterol (Calc): 172 mg/dL (calc) — ABNORMAL HIGH
Non-HDL Cholesterol (Calc): 202 mg/dL (calc) — ABNORMAL HIGH (ref <130)
Total CHOL/HDL Ratio: 7.3 (calc) — ABNORMAL HIGH (ref <5.0)
Triglycerides: 158 mg/dL — ABNORMAL HIGH (ref <150)

## 2020-11-03 LAB — CBC WITH DIFFERENTIAL/PLATELET
Absolute Monocytes: 459 cells/uL (ref 200–950)
Basophils Absolute: 62 cells/uL (ref 0–200)
Basophils Relative: 1.1 %
Eosinophils Absolute: 269 cells/uL (ref 15–500)
Eosinophils Relative: 4.8 %
HCT: 52.2 % — ABNORMAL HIGH (ref 38.5–50.0)
Hemoglobin: 17.1 g/dL (ref 13.2–17.1)
Lymphs Abs: 1378 cells/uL (ref 850–3900)
MCH: 27.3 pg (ref 27.0–33.0)
MCHC: 32.8 g/dL (ref 32.0–36.0)
MCV: 83.3 fL (ref 80.0–100.0)
MPV: 9.3 fL (ref 7.5–12.5)
Monocytes Relative: 8.2 %
Neutro Abs: 3433 cells/uL (ref 1500–7800)
Neutrophils Relative %: 61.3 %
Platelets: 143 10*3/uL (ref 140–400)
RBC: 6.27 10*6/uL — ABNORMAL HIGH (ref 4.20–5.80)
RDW: 13.4 % (ref 11.0–15.0)
Total Lymphocyte: 24.6 %
WBC: 5.6 10*3/uL (ref 3.8–10.8)

## 2020-11-03 LAB — HEMOGLOBIN A1C
Hgb A1c MFr Bld: 5.5 % of total Hgb (ref <5.7)
Mean Plasma Glucose: 111 mg/dL
eAG (mmol/L): 6.2 mmol/L

## 2020-11-08 DIAGNOSIS — H04123 Dry eye syndrome of bilateral lacrimal glands: Secondary | ICD-10-CM | POA: Diagnosis not present

## 2020-11-08 DIAGNOSIS — H0102A Squamous blepharitis right eye, upper and lower eyelids: Secondary | ICD-10-CM | POA: Diagnosis not present

## 2020-11-08 DIAGNOSIS — H40033 Anatomical narrow angle, bilateral: Secondary | ICD-10-CM | POA: Diagnosis not present

## 2020-11-08 DIAGNOSIS — E119 Type 2 diabetes mellitus without complications: Secondary | ICD-10-CM | POA: Diagnosis not present

## 2020-11-11 HISTORY — PX: IRIDOTOMY / IRIDECTOMY: SHX165

## 2020-11-12 ENCOUNTER — Other Ambulatory Visit: Payer: Self-pay | Admitting: Family

## 2020-11-12 DIAGNOSIS — E119 Type 2 diabetes mellitus without complications: Secondary | ICD-10-CM

## 2020-11-30 DIAGNOSIS — E291 Testicular hypofunction: Secondary | ICD-10-CM | POA: Diagnosis not present

## 2020-12-07 DIAGNOSIS — N5201 Erectile dysfunction due to arterial insufficiency: Secondary | ICD-10-CM | POA: Diagnosis not present

## 2020-12-07 DIAGNOSIS — E291 Testicular hypofunction: Secondary | ICD-10-CM | POA: Diagnosis not present

## 2020-12-13 ENCOUNTER — Other Ambulatory Visit: Payer: BC Managed Care – PPO

## 2020-12-13 ENCOUNTER — Other Ambulatory Visit: Payer: Self-pay

## 2020-12-13 DIAGNOSIS — E782 Mixed hyperlipidemia: Secondary | ICD-10-CM

## 2020-12-13 DIAGNOSIS — E1165 Type 2 diabetes mellitus with hyperglycemia: Secondary | ICD-10-CM

## 2020-12-13 LAB — LIPID PANEL
Cholesterol: 103 mg/dL (ref <200)
HDL: 34 mg/dL — ABNORMAL LOW (ref >=40)
LDL Cholesterol (Calc): 49 mg/dL (calc)
Non-HDL Cholesterol (Calc): 69 mg/dL (calc) (ref <130)
Total CHOL/HDL Ratio: 3 (calc) (ref <5.0)
Triglycerides: 112 mg/dL (ref <150)

## 2020-12-13 LAB — COMPLETE METABOLIC PANEL WITH GFR
AG Ratio: 1.5 (calc) (ref 1.0–2.5)
ALT: 35 U/L (ref 9–46)
AST: 26 U/L (ref 10–35)
Albumin: 4.4 g/dL (ref 3.6–5.1)
Alkaline phosphatase (APISO): 55 U/L (ref 35–144)
BUN: 22 mg/dL (ref 7–25)
CO2: 25 mmol/L (ref 20–32)
Calcium: 9.1 mg/dL (ref 8.6–10.3)
Chloride: 103 mmol/L (ref 98–110)
Creat: 0.92 mg/dL (ref 0.70–1.30)
Globulin: 3 g/dL (calc) (ref 1.9–3.7)
Glucose, Bld: 100 mg/dL — ABNORMAL HIGH (ref 65–99)
Potassium: 4.2 mmol/L (ref 3.5–5.3)
Sodium: 138 mmol/L (ref 135–146)
Total Bilirubin: 0.5 mg/dL (ref 0.2–1.2)
Total Protein: 7.4 g/dL (ref 6.1–8.1)
eGFR: 99 mL/min/{1.73_m2} (ref >=60)

## 2020-12-19 DIAGNOSIS — H40031 Anatomical narrow angle, right eye: Secondary | ICD-10-CM | POA: Diagnosis not present

## 2020-12-26 ENCOUNTER — Other Ambulatory Visit: Payer: Self-pay

## 2020-12-26 ENCOUNTER — Ambulatory Visit: Payer: BC Managed Care – PPO | Admitting: Podiatry

## 2020-12-26 DIAGNOSIS — L603 Nail dystrophy: Secondary | ICD-10-CM | POA: Diagnosis not present

## 2020-12-26 DIAGNOSIS — B351 Tinea unguium: Secondary | ICD-10-CM | POA: Diagnosis not present

## 2020-12-26 NOTE — Progress Notes (Signed)
  Subjective:  Patient ID: James Woods, male    DOB: May 18, 1965,  MRN: [id number]  No chief complaint on file.  55 y.o. male presents with the above complaint. History confirmed with patient. States he has completed the medication and noticed improvement in the nails of the right foot but they still are a little loose at the end of the nail.  Objective:  Physical Exam: warm, good capillary refill, nail exam dystrophic thickened nails right foot, left foot without dystrophy, no trophic changes or ulcerative lesions. DP pulses palpable, PT pulses palpable and protective sensation intact Left Foot: normal exam, no swelling, tenderness, instability; ligaments intact, full range of motion of all ankle/foot joints  Right Foot: normal exam, no swelling, tenderness, instability; ligaments intact, full range of motion of all ankle/foot joints    HPKs bilat heels, left 5th toe, left hallux  No images are attached to the encounter.  Assessment:   1. Onychomycosis   2. Nail dystrophy     Plan:  Patient was evaluated and treated and all questions answered.  DM without complication -Nails improving with clear growth, no further medication indictated . -Debrided nails today.  Procedure: Nail Debridement Type of Debridement: manual, sharp debridement. Instrumentation: Nail nipper, rotary burr. Number of Nails: 10   No follow-ups on file.

## 2021-01-09 ENCOUNTER — Ambulatory Visit: Payer: BC Managed Care – PPO | Admitting: Podiatry

## 2021-01-09 DIAGNOSIS — H40032 Anatomical narrow angle, left eye: Secondary | ICD-10-CM | POA: Diagnosis not present

## 2021-02-05 ENCOUNTER — Other Ambulatory Visit: Payer: Self-pay | Admitting: Adult Health

## 2021-02-08 ENCOUNTER — Telehealth (INDEPENDENT_AMBULATORY_CARE_PROVIDER_SITE_OTHER): Payer: PRIVATE HEALTH INSURANCE | Admitting: Adult Health

## 2021-02-08 ENCOUNTER — Encounter: Payer: Self-pay | Admitting: Adult Health

## 2021-02-08 DIAGNOSIS — F331 Major depressive disorder, recurrent, moderate: Secondary | ICD-10-CM

## 2021-02-08 DIAGNOSIS — F411 Generalized anxiety disorder: Secondary | ICD-10-CM | POA: Diagnosis not present

## 2021-02-08 MED ORDER — DIAZEPAM 5 MG PO TABS: 5.0000 mg | ORAL_TABLET | Freq: Two times a day (BID) | ORAL | 2 refills | Status: DC | PRN

## 2021-02-08 NOTE — Progress Notes (Signed)
James Woods [id number] 19-Dec-1965 55 y.o.  Virtual Visit via Video Note  I connected with pt @ on 02/08/21 at 11:40 AM EST by a video enabled telemedicine application and verified that I am speaking with the correct person using two identifiers.   I discussed the limitations of evaluation and management by telemedicine and the availability of in person appointments. The patient expressed understanding and agreed to proceed.  I discussed the assessment and treatment plan with the patient. The patient was provided an opportunity to ask questions and all were answered. The patient agreed with the plan and demonstrated an understanding of the instructions.   The patient was advised to call back or seek an in-person evaluation if the symptoms worsen or if the condition fails to improve as anticipated.  I provided 10 minutes of non-face-to-face time during this encounter.  The patient was located at home.  The provider was located at [facility].   Dorothyann Gibbs, NP   Subjective:   Patient ID:  James Woods is a 55 y.o. (DOB [birth year]) male.  Chief Complaint: No chief complaint on file.   HPI James Woods presents for follow-up of MDD and GAD.   Describes mood today as "ok". Pleasant. Denies tearfulness. Mood symptoms - reports situational depression and anxiety. Denies irritability. Stating "I'm doing alright". Feels like the Valium works well for him and would like to continue on an as needed basis.  Stable interest and motivation. Taking medications as prescribed. Energy levels stable. Active, does not have a regular exercise routine. Walking. Enjoys some usual interests and activities. Married. Separated. Has 2 grown children - daughters 22 and 67. Spending time with family. Appetite adequate. Weight stable 190 pounds.  Sleeps well most nights. Averages 6.5 hours. Focus and concentration stable. Completing tasks. Managing aspects of household. Works full-time - [employer]. Denies SI or HI.  Denies AH or VH. Denies substance use.    Review of Systems:  Review of Systems  Musculoskeletal:  Negative for gait problem.  Neurological:  Negative for tremors.  Psychiatric/Behavioral:         Please refer to HPI   Medications: I have reviewed the patient's current medications.  Current Outpatient Medications  Medication Sig Dispense Refill   diazepam (VALIUM) 5 MG tablet Take 1 tablet (5 mg total) by mouth 2 (two) times daily as needed. 60 tablet 2   diphenhydrAMINE HCl, Sleep, (SLEEP AID) 50 MG CAPS Take 1 capsule by mouth at bedtime.     fluconazole (DIFLUCAN) 150 MG tablet Take 1 tablet (150 mg total) by mouth once a week. 4 tablet 2   lisinopril (ZESTRIL) 5 MG tablet TAKE 1 TABLET BY MOUTH DAILY FOR KIDNEY PROTECTION DUE TO DIABETES 90 tablet 1   rosuvastatin (CRESTOR) 40 MG tablet Take 1 tablet (40 mg total) by mouth daily. 90 tablet 3   sildenafil (VIAGRA) 100 MG tablet TAKE 1 TABLET BY MOUTH AS NEEDED. FOR ERECTILE DYSFUNCTION. 6 tablet 6   testosterone cypionate (DEPOTESTOTERONE CYPIONATE) 100 MG/ML injection Inject 100 mg into the muscle once a week.     TRULICITY 0.75 MG/0.5ML SOPN INJECT 0.75 MG INTO THE SKIN ONCE A WEEK. 2 mL 11   No current facility-administered medications for this visit.    Medication Side Effects: None  Allergies:  Allergies  Allergen Reactions   Penicillins Rash    Past Medical History:  Diagnosis Date   Anxiety    Diabetes (HCC)    High cholesterol  Low testosterone in male    Overweight     Family History  Problem Relation Age of Onset   Stroke Mother    Diabetes Mother    Stroke Father     Social History   Socioeconomic History   Marital status: Married    Spouse name: Not on file   Number of children: Not on file   Years of education: Not on file   Highest education level: Not on file  Occupational History   Not on file  Tobacco Use   Smoking status: Every Day     Packs/day: 0.50    Years: 30.00    Pack years: 15.00    Types: Cigarettes   Smokeless tobacco: Never   Tobacco comments:    Almost quit 2-3 cig per week  Vaping Use   Vaping Use: Every day   Start date: 07/12/2020  Substance and Sexual Activity   Alcohol use: Not Currently    Comment:  every 6 months will have a beer   Drug use: Never   Sexual activity: Not on file  Other Topics Concern   Not on file  Social History Narrative   Tobacco use:  1/2 pack per day, 30 years   Alcohol use: None   Diet:     Do you drink/eat things with caffeine?  No.   Marital status: Married in 1993.    Do you live in a house, apartment, assisted living, condo, trailer, etc.)? House   Is it one or more stories? One story.   How many persons live in your home?  2   Do you have any pets in your home? (please list)  3 dogs   Highest level of education: - Some college.   Current or past profession:  Retail   Do you exercise:  No.       Advanced Directive   No Living Will, no DNR, no POA/HPOA      FUNCTIONAL STATUS   No difficulty with bathing, dressing, preparing food, eating, managing medications, finances. No difficulty with affording medications.    Social Determinants of Health   Financial Resource Strain: Not on file  Food Insecurity: Not on file  Transportation Needs: Not on file  Physical Activity: Not on file  Stress: Not on file  Social Connections: Not on file  Intimate Partner Violence: Not on file    Past Medical History, Surgical history, Social history, and Family history were reviewed and updated as appropriate.   Please see review of systems for further details on the patient's review from today.   Objective:   Physical Exam:  There were no vitals taken for this visit.  Physical Exam Constitutional:      General: He is not in acute distress. Musculoskeletal:        General: No deformity.  Neurological:     Mental Status: He is alert and oriented to person, place, and  time.     Coordination: Coordination normal.  Psychiatric:        Attention and Perception: Attention and perception normal. He does not perceive auditory or visual hallucinations.        Mood and Affect: Mood normal. Mood is not anxious or depressed. Affect is not labile, blunt, angry or inappropriate.        Speech: Speech normal.        Behavior: Behavior normal.        Thought Content: Thought content normal. Thought content is not paranoid or delusional.  Thought content does not include homicidal or suicidal ideation. Thought content does not include homicidal or suicidal plan.        Cognition and Memory: Cognition and memory normal.        Judgment: Judgment normal.     Comments: Insight intact    Lab Review:     Component Value Date/Time   NA 138 12/13/2020 0806   K 4.2 12/13/2020 0806   CL 103 12/13/2020 0806   CO2 25 12/13/2020 0806   GLUCOSE 100 (H) 12/13/2020 0806   BUN 22 12/13/2020 0806   CREATININE 0.92 12/13/2020 0806   CALCIUM 9.1 12/13/2020 0806   PROT 7.4 12/13/2020 0806   AST 26 12/13/2020 0806   ALT 35 12/13/2020 0806   BILITOT 0.5 12/13/2020 0806   GFRNONAA 84 06/01/2020 0813   GFRAA 97 06/01/2020 0813       Component Value Date/Time   WBC 5.6 11/02/2020 0932   RBC 6.27 (H) 11/02/2020 0932   HGB 17.1 11/02/2020 0932   HCT 52.2 (H) 11/02/2020 0932   PLT 143 11/02/2020 0932   MCV 83.3 11/02/2020 0932   MCH 27.3 11/02/2020 0932   MCHC 32.8 11/02/2020 0932   RDW 13.4 11/02/2020 0932   LYMPHSABS 1,378 11/02/2020 0932   EOSABS 269 11/02/2020 0932   BASOSABS 62 11/02/2020 0932    No results found for: POCLITH, LITHIUM   No results found for: PHENYTOIN, PHENOBARB, VALPROATE, CBMZ   .res Assessment: Plan:    Plan:  PDMP reviewed  Valium 5mg  twice daily   RTC 3 months  Patient advised to contact office with any questions, adverse effects, or acute worsening in signs and symptoms.  Discussed potential benefits, risk, and side effects of  benzodiazepines to include potential risk of tolerance and dependence, as well as possible drowsiness.  Advised patient not to drive if experiencing drowsiness and to take lowest possible effective dose to minimize risk of dependence and tolerance.  Diagnoses and all orders for this visit:  Generalized anxiety disorder -     diazepam (VALIUM) 5 MG tablet; Take 1 tablet (5 mg total) by mouth 2 (two) times daily as needed.  Major depressive disorder, recurrent episode, moderate (HCC)     Please see After Visit Summary for patient specific instructions.  Future Appointments  Date Time Provider Department Center  02/14/2021 10:30 AM Ngetich, 04/14/2021, NP [facility] None    No orders of the defined types were placed in this encounter.     -------------------------------

## 2021-02-14 ENCOUNTER — Ambulatory Visit: Payer: BC Managed Care – PPO | Admitting: Family

## 2021-04-13 ENCOUNTER — Encounter: Payer: Self-pay | Admitting: Nurse Practitioner

## 2021-04-13 ENCOUNTER — Other Ambulatory Visit: Payer: Self-pay

## 2021-04-13 ENCOUNTER — Ambulatory Visit: Payer: BC Managed Care – PPO | Admitting: Nurse Practitioner

## 2021-04-13 VITALS — BP 110/70 | HR 86 | Temp 97.3°F | Ht 68.0 in | Wt 200.8 lb

## 2021-04-13 DIAGNOSIS — F5101 Primary insomnia: Secondary | ICD-10-CM

## 2021-04-13 DIAGNOSIS — E6609 Other obesity due to excess calories: Secondary | ICD-10-CM

## 2021-04-13 DIAGNOSIS — F324 Major depressive disorder, single episode, in partial remission: Secondary | ICD-10-CM

## 2021-04-13 DIAGNOSIS — F411 Generalized anxiety disorder: Secondary | ICD-10-CM

## 2021-04-13 DIAGNOSIS — Z6830 Body mass index (BMI) 30.0-30.9, adult: Secondary | ICD-10-CM

## 2021-04-13 DIAGNOSIS — E1169 Type 2 diabetes mellitus with other specified complication: Secondary | ICD-10-CM

## 2021-04-13 DIAGNOSIS — E782 Mixed hyperlipidemia: Secondary | ICD-10-CM

## 2021-04-13 DIAGNOSIS — N529 Male erectile dysfunction, unspecified: Secondary | ICD-10-CM

## 2021-04-13 DIAGNOSIS — E66811 Other obesity due to excess calories: Secondary | ICD-10-CM

## 2021-04-13 MED ORDER — TRULICITY 0.75 MG/0.5ML ~~LOC~~ SOAJ: 0.7500 mg | SUBCUTANEOUS | 11 refills | Status: DC

## 2021-04-13 MED ORDER — SILDENAFIL CITRATE 100 MG PO TABS: ORAL_TABLET | ORAL | 6 refills | Status: DC

## 2021-04-13 NOTE — Patient Instructions (Signed)
Please sign a record release for [facility] on check out.  ? ?

## 2021-04-13 NOTE — Progress Notes (Signed)
? ? ?Careteam: ?Patient Care Team: ?James Chandler, NP as PCP - General (Geriatric Medicine) ? ?PLACE OF SERVICE:  ?[facility]  ?Advanced Directive information ?Does Patient Have a Medical Advance Directive?: No ? ?Allergies  ?Allergen Reactions  ? Penicillins Rash  ? ? ?Chief Complaint  ?Patient presents with  ? Medical Management of Chronic Issues  ?  Routine follow up.  ? Health Maintenance  ?  HIV screening,tetanus/tdap, shingrix vaccine, 2nd COVID booster, eye exam  ? ? ? ?HPI: Patient is a 56 y.o. male  ? ?Diabetes- only on Trulicity. Off Metformin, no hypoglycemia  ?He is up to date on ophthalmology visit  ? ?Anxiety- diazepam 5mg  BID prn. No issues works well. Followed by psych NP.  ? ?Insomnia - takes Diphrenhydramine 50 HS, no issues, works great, no side effects noted.  ? ?Hyperlipidemia - Takes Crestor 40 mg QD. No issues with medication.  ? ?Will get tetanus, shringrix and covid booster when convenient.  ? ? ?Review of Systems:  ?Review of Systems  ?Constitutional:  Negative for chills, fever and weight loss.  ?Respiratory:  Negative for cough, shortness of breath and wheezing.   ?Cardiovascular:  Negative for chest pain, palpitations and leg swelling.  ?Gastrointestinal:  Negative for abdominal pain, constipation, diarrhea, heartburn, nausea and vomiting.  ?Genitourinary:  Negative for dysuria, frequency and urgency.  ?Psychiatric/Behavioral:  The patient is nervous/anxious and has insomnia.   ?     Controlled with benadryl and diazepam  ? ?Past Medical History:  ?Diagnosis Date  ? Anxiety   ? Diabetes ([place])   ? High cholesterol   ? Low testosterone in male   ? Overweight   ? ?Past Surgical History:  ?Procedure Laterality Date  ? COLONOSCOPY  2018  ? None    ? ?Social History: ?  reports that he has quit smoking. His smoking use included cigarettes. He has a 15.00 pack-year smoking history. He has never used smokeless tobacco. He reports that he does not currently use alcohol. He reports that he  does not use drugs. ? ?Family History  ?Problem Relation Age of Onset  ? Stroke Mother   ? Diabetes Mother   ? Stroke Father   ? ? ?Medications: ?Patient's Medications  ?New Prescriptions  ? No medications on file  ?Previous Medications  ? DIAZEPAM (VALIUM) 5 MG TABLET    Take 1 tablet (5 mg total) by mouth 2 (two) times daily as needed.  ? DIPHENHYDRAMINE HCL, SLEEP, (SLEEP AID) 50 MG CAPS    Take 1 capsule by mouth at bedtime.  ? ROSUVASTATIN (CRESTOR) 40 MG TABLET    Take 1 tablet (40 mg total) by mouth daily.  ? TESTOSTERONE CYPIONATE (DEPOTESTOTERONE CYPIONATE) 100 MG/ML INJECTION    Inject 100 mg into the muscle once a week.  ?Modified Medications  ? Modified Medication Previous Medication  ? DULAGLUTIDE (TRULICITY) 6.96 EX/5.2WU SOPN TRULICITY 1.32 GM/0.1UU SOPN  ?    Inject 0.75 mg into the skin once a week.    INJECT 0.75 MG INTO THE SKIN ONCE A WEEK.  ? SILDENAFIL (VIAGRA) 100 MG TABLET sildenafil (VIAGRA) 100 MG tablet  ?    TAKE 1 TABLET BY MOUTH AS NEEDED. FOR ERECTILE DYSFUNCTION.    TAKE 1 TABLET BY MOUTH AS NEEDED. FOR ERECTILE DYSFUNCTION.  ?Discontinued Medications  ? FLUCONAZOLE (DIFLUCAN) 150 MG TABLET    Take 1 tablet (150 mg total) by mouth once a week.  ? LISINOPRIL (ZESTRIL) 5 MG TABLET  TAKE 1 TABLET BY MOUTH DAILY FOR KIDNEY PROTECTION DUE TO DIABETES  ? ? ?Physical Exam: ? ?Vitals:  ? 04/13/21 1601  ?BP: 110/70  ?Pulse: 86  ?Temp: (!) 97.3 ?F (36.3 ?C)  ?SpO2: 97%  ?Weight: 200 lb 12.8 oz (91.1 kg)  ?Height: $RemoveB'5\' 8"'WzmpRZXE$  (1.727 m)  ? ?Body mass index is 30.53 kg/m?. ?Wt Readings from Last 3 Encounters:  ?04/13/21 200 lb 12.8 oz (91.1 kg)  ?11/01/20 191 lb 6.4 oz (86.8 kg)  ?06/16/20 200 lb (90.7 kg)  ? ? ?Physical Exam ?Constitutional:   ?   Appearance: Normal appearance. He is obese.  ?HENT:  ?   Right Ear: Tympanic membrane, ear canal and external ear normal.  ?   Left Ear: Tympanic membrane and ear canal normal.  ?   Mouth/Throat:  ?   Mouth: Mucous membranes are moist.  ?   Pharynx:  Oropharynx is clear.  ?Eyes:  ?   Conjunctiva/sclera: Conjunctivae normal.  ?Cardiovascular:  ?   Rate and Rhythm: Normal rate and regular rhythm.  ?   Pulses: Normal pulses.  ?   Heart sounds: Normal heart sounds.  ?Pulmonary:  ?   Effort: Pulmonary effort is normal.  ?   Breath sounds: Normal breath sounds.  ?Abdominal:  ?   General: Bowel sounds are normal.  ?Musculoskeletal:     ?   General: Normal range of motion.  ?   Cervical back: Normal range of motion.  ?Neurological:  ?   Mental Status: He is alert and oriented to person, place, and time.  ?Psychiatric:     ?   Mood and Affect: Mood normal.     ?   Behavior: Behavior normal.     ?   Thought Content: Thought content normal.     ?   Judgment: Judgment normal.  ? ? ?Labs reviewed: ?Basic Metabolic Panel: ?Recent Labs  ?  06/01/20 ?8676 11/01/20 ?0000 12/13/20 ?1950  ?NA 137 136 138  ?K 4.5 4.4 4.2  ?CL 102 102 103  ?CO2 $Rem'27 24 25  'gwFV$ ?GLUCOSE 106* 89 100*  ?BUN $Rem'21 19 22  'BnTv$ ?CREATININE 1.02 0.89 0.92  ?CALCIUM 9.6 9.4 9.1  ? ?Liver Function Tests: ?Recent Labs  ?  06/01/20 ?9326 11/01/20 ?0000 12/13/20 ?7124  ?AST $Rem'18 15 26  'vvAK$ ?ALT 25 13 35  ?BILITOT 0.4 0.8 0.5  ?PROT 7.5 7.8 7.4  ? ?No results for input(s): LIPASE, AMYLASE in the last 8760 hours. ?No results for input(s): AMMONIA in the last 8760 hours. ?CBC: ?Recent Labs  ?  11/02/20 ?0932  ?WBC 5.6  ?NEUTROABS 3,433  ?HGB 17.1  ?HCT 52.2*  ?MCV 83.3  ?PLT 143  ? ?Lipid Panel: ?Recent Labs  ?  06/01/20 ?5809 11/01/20 ?0000 12/13/20 ?9833  ?CHOL 199 234* 103  ?HDL 28* 32* 34*  ?[place] 139* 172* 49  ?TRIG 188* 158* 112  ?CHOLHDL 7.1* 7.3* 3.0  ? ?TSH: ?No results for input(s): TSH in the last 8760 hours. ?A1C: ?Lab Results  ?Component Value Date  ? HGBA1C 5.5 11/02/2020  ? ? ? ?Assessment/Plan ?1. Type 2 diabetes mellitus with other specified complication, without long-term current use of insulin ([place]) ?-only using  Trulicity weekly at this time ?-Encouraged dietary compliance, routine foot care/monitoring and to  keep up with diabetic eye exams through ophthalmology  ?- Hemoglobin A1c ?- CMP with eGFR([facility]) ?- CBC with Differential/Platelet ? ?2. Depression, major, single episode, in partial remission ([place]) ?-Takes diazepam 5 BID for anxiety, no worsening  depression.  ? ?3. Mixed hyperlipidemia ?Continues on crestor with dietary modifications ?- CMP with eGFR([facility]) ?- Lipid panel ? ?4. Primary insomnia ?- Takes diphenhydramine as needed which has been working well. No side effects noted.  ?-Controlled, no issues.  ? ?5. Generalized anxiety disorder ?-Sees a psych NP and well managed on diazepam twice daily with lifestyle modifications.  ? ?6. Erectile dysfunction, unspecified erectile dysfunction type ?Refill provided ?- sildenafil (VIAGRA) 100 MG tablet; TAKE 1 TABLET BY MOUTH AS NEEDED. FOR ERECTILE DYSFUNCTION.  Dispense: 6 tablet; Refill: 6 ? ?7. Class 1 obesity due to excess calories with serious comorbidity and body mass index (BMI) of 30.0 to 30.9 in adult ?-education provided on healthy weight loss through increase in physical activity and proper nutrition  ? ? ? ?Next appt: Return in about 6 months (around 10/14/2021). ? ?James Woods. Dewaine Oats, AGNP ?I personally was present during the history, physical exam and medical decision-making activities of this service and have verified that the service and findings are accurately documented in the student?s note ?[facility] ?[phone]  ? ?

## 2021-04-14 LAB — CBC WITH DIFFERENTIAL/PLATELET
Absolute Monocytes: 390 cells/uL (ref 200–950)
Basophils Absolute: 59 cells/uL (ref 0–200)
Basophils Relative: 0.9 %
Eosinophils Absolute: 299 cells/uL (ref 15–500)
Eosinophils Relative: 4.6 %
HCT: 49.7 % (ref 38.5–50.0)
Hemoglobin: 16.2 g/dL (ref 13.2–17.1)
Lymphs Abs: 2119 cells/uL (ref 850–3900)
MCH: 27.2 pg (ref 27.0–33.0)
MCHC: 32.6 g/dL (ref 32.0–36.0)
MCV: 83.5 fL (ref 80.0–100.0)
MPV: 9.2 fL (ref 7.5–12.5)
Monocytes Relative: 6 %
Neutro Abs: 3634 cells/uL (ref 1500–7800)
Neutrophils Relative %: 55.9 %
Platelets: 157 10*3/uL (ref 140–400)
RBC: 5.95 10*6/uL — ABNORMAL HIGH (ref 4.20–5.80)
RDW: 13.2 % (ref 11.0–15.0)
Total Lymphocyte: 32.6 %
WBC: 6.5 10*3/uL (ref 3.8–10.8)

## 2021-04-14 LAB — COMPLETE METABOLIC PANEL WITH GFR
AG Ratio: 1.4 (calc) (ref 1.0–2.5)
ALT: 27 U/L (ref 9–46)
AST: 24 U/L (ref 10–35)
Albumin: 4.6 g/dL (ref 3.6–5.1)
Alkaline phosphatase (APISO): 58 U/L (ref 35–144)
BUN: 21 mg/dL (ref 7–25)
CO2: 24 mmol/L (ref 20–32)
Calcium: 9.4 mg/dL (ref 8.6–10.3)
Chloride: 102 mmol/L (ref 98–110)
Creat: 0.85 mg/dL (ref 0.70–1.30)
Globulin: 3.3 g/dL (calc) (ref 1.9–3.7)
Glucose, Bld: 85 mg/dL (ref 65–99)
Potassium: 4.1 mmol/L (ref 3.5–5.3)
Sodium: 138 mmol/L (ref 135–146)
Total Bilirubin: 0.7 mg/dL (ref 0.2–1.2)
Total Protein: 7.9 g/dL (ref 6.1–8.1)
eGFR: 103 mL/min/{1.73_m2} (ref >=60)

## 2021-04-14 LAB — LIPID PANEL
Cholesterol: 114 mg/dL (ref <200)
HDL: 30 mg/dL — ABNORMAL LOW (ref >=40)
LDL Cholesterol (Calc): 60 mg/dL (calc)
Non-HDL Cholesterol (Calc): 84 mg/dL (calc) (ref <130)
Total CHOL/HDL Ratio: 3.8 (calc) (ref <5.0)
Triglycerides: 163 mg/dL — ABNORMAL HIGH (ref <150)

## 2021-04-14 LAB — MICROALBUMIN, URINE: Microalb, Ur: 0.5 mg/dL

## 2021-04-14 LAB — HEMOGLOBIN A1C
Hgb A1c MFr Bld: 5.7 % of total Hgb — ABNORMAL HIGH (ref <5.7)
Mean Plasma Glucose: 117 mg/dL
eAG (mmol/L): 6.5 mmol/L

## 2021-05-15 ENCOUNTER — Other Ambulatory Visit: Payer: Self-pay | Admitting: Adult Health

## 2021-05-15 DIAGNOSIS — F411 Generalized anxiety disorder: Secondary | ICD-10-CM

## 2021-05-16 ENCOUNTER — Telehealth: Payer: Self-pay | Admitting: Adult Health

## 2021-05-16 ENCOUNTER — Other Ambulatory Visit: Payer: Self-pay | Admitting: Adult Health

## 2021-05-16 DIAGNOSIS — F411 Generalized anxiety disorder: Secondary | ICD-10-CM

## 2021-05-16 MED ORDER — DIAZEPAM 5 MG PO TABS: 5.0000 mg | ORAL_TABLET | Freq: Two times a day (BID) | ORAL | 2 refills | Status: DC | PRN

## 2021-05-16 NOTE — Telephone Encounter (Signed)
Patient called in for refill on Diazepam 5mg . Ph: [phone]. Appt  4/27. Pharmacy [facility] [address] [place] ?

## 2021-05-16 NOTE — Telephone Encounter (Signed)
Script sent  

## 2021-05-17 DIAGNOSIS — E291 Testicular hypofunction: Secondary | ICD-10-CM | POA: Diagnosis not present

## 2021-05-17 DIAGNOSIS — Z125 Encounter for screening for malignant neoplasm of prostate: Secondary | ICD-10-CM | POA: Diagnosis not present

## 2021-05-31 DIAGNOSIS — E291 Testicular hypofunction: Secondary | ICD-10-CM | POA: Diagnosis not present

## 2021-05-31 DIAGNOSIS — N5201 Erectile dysfunction due to arterial insufficiency: Secondary | ICD-10-CM | POA: Diagnosis not present

## 2021-06-07 ENCOUNTER — Encounter: Payer: Self-pay | Admitting: Adult Health

## 2021-06-07 ENCOUNTER — Ambulatory Visit (INDEPENDENT_AMBULATORY_CARE_PROVIDER_SITE_OTHER): Payer: BC Managed Care – PPO | Admitting: Adult Health

## 2021-06-07 DIAGNOSIS — F411 Generalized anxiety disorder: Secondary | ICD-10-CM | POA: Diagnosis not present

## 2021-06-07 MED ORDER — DIAZEPAM 5 MG PO TABS: 5.0000 mg | ORAL_TABLET | Freq: Two times a day (BID) | ORAL | 2 refills | Status: DC | PRN

## 2021-06-07 NOTE — Progress Notes (Signed)
Aydian Eavey ?[id number] ?19-Oct-1965 ?56 y.o. ? ?Virtual Visit via Telephone Note ? ?I connected with pt on 06/07/21 at 11:40 AM EDT by telephone and verified that I am speaking with the correct person using two identifiers. ?  ?I discussed the limitations, risks, security and privacy concerns of performing an evaluation and management service by telephone and the availability of in person appointments. I also discussed with the patient that there may be a patient responsible charge related to this service. The patient expressed understanding and agreed to proceed. ?  ?I discussed the assessment and treatment plan with the patient. The patient was provided an opportunity to ask questions and all were answered. The patient agreed with the plan and demonstrated an understanding of the instructions. ?  ?The patient was advised to call back or seek an in-person evaluation if the symptoms worsen or if the condition fails to improve as anticipated. ? ?I provided 10 minutes of non-face-to-face time during this encounter.  The patient was located at home.  The provider was located at [place]. ? ? ?James Gell, NP ? ? ?Subjective:  ? ?Patient ID:  James Woods is a 56 y.o. (DOB 10-Jun-1965) male. ? ?Chief Complaint: No chief complaint on file. ? ? ?HPI ?James Woods presents for follow-up of MDD and GAD.  ? ?Describes mood today as "ok". Pleasant. Denies tearfulness. Mood symptoms - reports some situational depression and anxiety with. Denies irritability. Stating "I'm doing ok". Feels like the Valium continues to work well for him. ? Stable interest and motivation. Taking medications as prescribed. ?Energy levels stable. Active, does not have a regular exercise routine. Walking. ?Enjoys some usual interests and activities. Married. Separated. Divorce final in [place] 2 grown children - daughters 19 and 6. Spending time with family. ?Appetite adequate. Weight gain 190 to 198 pounds.  ?Sleeps well most  nights. Averages 6.5 hours. ?Focus and concentration stable. Completing tasks. Managing aspects of household. Works full-time - [employer]. ?Denies SI or HI.  ?Denies AH or VH. ?Denies substance use. ? ? ?Review of Systems:  ?Review of Systems  ?Musculoskeletal:  Negative for gait problem.  ?Neurological:  Negative for tremors.  ?Psychiatric/Behavioral:    ?     Please refer to HPI  ? ?Medications: I have reviewed the patient's current medications. ? ?Current Outpatient Medications  ?Medication Sig Dispense Refill  ? diazepam (VALIUM) 5 MG tablet Take 1 tablet (5 mg total) by mouth 2 (two) times daily as needed. 60 tablet 2  ? diphenhydrAMINE HCl, Sleep, (SLEEP AID) 50 MG CAPS Take 1 capsule by mouth at bedtime.    ? Dulaglutide (TRULICITY) [id number] SOPN Inject 0.75 mg into the skin once a week. 2 mL 11  ? rosuvastatin (CRESTOR) 40 MG tablet Take 1 tablet (40 mg total) by mouth daily. 90 tablet 3  ? sildenafil (VIAGRA) 100 MG tablet TAKE 1 TABLET BY MOUTH AS NEEDED. FOR ERECTILE DYSFUNCTION. 6 tablet 6  ? testosterone cypionate (DEPOTESTOTERONE CYPIONATE) 100 MG/ML injection Inject 100 mg into the muscle once a week.    ? ?No current facility-administered medications for this visit.  ? ? ?Medication Side Effects: None ? ?Allergies:  ?Allergies  ?Allergen Reactions  ? Penicillins Rash  ? ? ?Past Medical History:  ?Diagnosis Date  ? Anxiety   ? Diabetes ([place])   ? High cholesterol   ? Low testosterone in male   ? Overweight   ? ? ?Family History  ?Problem Relation Age of Onset  ?  Stroke Mother   ? Diabetes Mother   ? Stroke Father   ? ? ?Social History  ? ?Socioeconomic History  ? Marital status: Married  ?  Spouse name: Not on file  ? Number of children: Not on file  ? Years of education: Not on file  ? Highest education level: Not on file  ?Occupational History  ? Not on file  ?Tobacco Use  ? Smoking status: Former  ?  Packs/day: 0.50  ?  Years: 30.00  ?  Pack years: 15.00  ?  Types: Cigarettes  ?  Smokeless tobacco: Never  ? Tobacco comments:  ?  Almost quit 2-3 cig per week  ?Vaping Use  ? Vaping Use: Some days  ? Start date: 07/12/2020  ?Substance and Sexual Activity  ? Alcohol use: Not Currently  ?  Comment:  every 6 months will have a beer  ? Drug use: Never  ? Sexual activity: Not on file  ?Other Topics Concern  ? Not on file  ?Social History Narrative  ? Tobacco use:  1/2 pack per day, 30 years  ? Alcohol use: None  ? Diet:    ? Do you drink/eat things with caffeine?  No.  ? Marital status: Married in 1993.   ? Do you live in a house, apartment, assisted living, condo, trailer, etc.)? House  ? Is it one or more stories? One story.  ? How many persons live in your home?  2  ? Do you have any pets in your home? (please list)  3 dogs  ? Highest level of education: - Some college.  ? Current or past profession:  Retail  ? Do you exercise:  No.   ?   ? Advanced Directive  ? No Living Will, no DNR, no POA/HPOA  ?   ? FUNCTIONAL STATUS  ? No difficulty with bathing, dressing, preparing food, eating, managing medications, finances. No difficulty with affording medications.   ? ?Social Determinants of Health  ? ?Financial Resource Strain: Not on file  ?Food Insecurity: Not on file  ?Transportation Needs: Not on file  ?Physical Activity: Not on file  ?Stress: Not on file  ?Social Connections: Not on file  ?Intimate Partner Violence: Not on file  ? ? ?Past Medical History, Surgical history, Social history, and Family history were reviewed and updated as appropriate.  ? ?Please see review of systems for further details on the patient's review from today.  ? ?Objective:  ? ?Physical Exam:  ?There were no vitals taken for this visit. ? ?Physical Exam ?Constitutional:   ?   General: He is not in acute distress. ?Musculoskeletal:     ?   General: No deformity.  ?Neurological:  ?   Mental Status: He is alert and oriented to person, place, and time.  ?   Coordination: Coordination normal.  ?Psychiatric:     ?   Attention  and Perception: Attention and perception normal. He does not perceive auditory or visual hallucinations.     ?   Mood and Affect: Mood normal. Mood is not anxious or depressed. Affect is not labile, blunt, angry or inappropriate.     ?   Speech: Speech normal.     ?   Behavior: Behavior normal.     ?   Thought Content: Thought content normal. Thought content is not paranoid or delusional. Thought content does not include homicidal or suicidal ideation. Thought content does not include homicidal or suicidal plan.     ?  Cognition and Memory: Cognition and memory normal.     ?   Judgment: Judgment normal.  ?   Comments: Insight intact  ? ? ?Lab Review:  ?   ?Component Value Date/Time  ? NA 138 04/13/2021 1541  ? K 4.1 04/13/2021 1541  ? CL 102 04/13/2021 1541  ? CO2 24 04/13/2021 1541  ? GLUCOSE 85 04/13/2021 1541  ? BUN 21 04/13/2021 1541  ? CREATININE 0.85 04/13/2021 1541  ? CALCIUM 9.4 04/13/2021 1541  ? PROT 7.9 04/13/2021 1541  ? AST 24 04/13/2021 1541  ? ALT 27 04/13/2021 1541  ? BILITOT 0.7 04/13/2021 1541  ? GFRNONAA 84 06/01/2020 0813  ? GFRAA 97 06/01/2020 0813  ? ? ?   ?Component Value Date/Time  ? WBC 6.5 04/13/2021 1541  ? RBC 5.95 (H) 04/13/2021 1541  ? HGB 16.2 04/13/2021 1541  ? HCT 49.7 04/13/2021 1541  ? PLT 157 04/13/2021 1541  ? MCV 83.5 04/13/2021 1541  ? MCH 27.2 04/13/2021 1541  ? MCHC 32.6 04/13/2021 1541  ? RDW 13.2 04/13/2021 1541  ? LYMPHSABS 2,119 04/13/2021 1541  ? EOSABS 299 04/13/2021 1541  ? BASOSABS 59 04/13/2021 1541  ? ? ?No results found for: POCLITH, LITHIUM  ? ?No results found for: PHENYTOIN, PHENOBARB, VALPROATE, CBMZ  ? ?.res ?Assessment: Plan:   ? ?Plan: ? ?PDMP reviewed ? ?Valium 5mg  twice daily  ? ?RTC 3 months ? ?Patient advised to contact office with any questions, adverse effects, or acute worsening in signs and symptoms. ? ?Discussed potential benefits, risk, and side effects of benzodiazepines to include potential risk of tolerance and dependence, as well as possible  drowsiness.  Advised patient not to drive if experiencing drowsiness and to take lowest possible effective dose to minimize risk of dependence and tolerance. ? ? ?Diagnoses and all orders for this visit:

## 2021-06-07 NOTE — Addendum Note (Signed)
Addended by: Dorothyann Gibbs on: 06/07/2021 02:28 PM ? ? Modules accepted: Level of Service ? ?

## 2021-06-21 ENCOUNTER — Other Ambulatory Visit: Payer: Self-pay | Admitting: Nurse Practitioner

## 2021-06-21 DIAGNOSIS — E1169 Type 2 diabetes mellitus with other specified complication: Secondary | ICD-10-CM

## 2021-06-21 DIAGNOSIS — E782 Mixed hyperlipidemia: Secondary | ICD-10-CM

## 2021-07-12 DIAGNOSIS — H04123 Dry eye syndrome of bilateral lacrimal glands: Secondary | ICD-10-CM | POA: Diagnosis not present

## 2021-07-12 DIAGNOSIS — H1045 Other chronic allergic conjunctivitis: Secondary | ICD-10-CM | POA: Diagnosis not present

## 2021-07-12 DIAGNOSIS — H16143 Punctate keratitis, bilateral: Secondary | ICD-10-CM | POA: Diagnosis not present

## 2021-07-12 DIAGNOSIS — H0102A Squamous blepharitis right eye, upper and lower eyelids: Secondary | ICD-10-CM | POA: Diagnosis not present

## 2021-08-02 ENCOUNTER — Telehealth (INDEPENDENT_AMBULATORY_CARE_PROVIDER_SITE_OTHER): Payer: BC Managed Care – PPO | Admitting: Nurse Practitioner

## 2021-08-02 ENCOUNTER — Telehealth: Payer: Self-pay | Admitting: *Deleted

## 2021-08-02 DIAGNOSIS — E1169 Type 2 diabetes mellitus with other specified complication: Secondary | ICD-10-CM | POA: Diagnosis not present

## 2021-08-02 DIAGNOSIS — E6609 Other obesity due to excess calories: Secondary | ICD-10-CM | POA: Diagnosis not present

## 2021-08-02 DIAGNOSIS — Z6830 Body mass index (BMI) 30.0-30.9, adult: Secondary | ICD-10-CM

## 2021-08-02 MED ORDER — OZEMPIC (0.25 OR 0.5 MG/DOSE) 2 MG/3ML ~~LOC~~ SOPN: PEN_INJECTOR | SUBCUTANEOUS | 1 refills | Status: DC

## 2021-08-02 NOTE — Progress Notes (Signed)
Careteam: Patient Care Team: Sharon Seller, NP as PCP - General (Geriatric Medicine)  Advanced Directive information    Allergies  Allergen Reactions   Penicillins Rash    Chief Complaint  Patient presents with   Acute Visit    Wants to discuss Changing Trulicity. Reqesting to change to Ozempic      HPI: Patient is a 56 y.o. male to discuss medication change.  Reports he was looking up the difference between trulicity and ozempic and request to change to ozempic to help with weight loss along with diabetic management.  He has gained weight, up to 215 lbs  Unable to walk like he used to He is stress eating.  Lots of increase in stress.   Review of Systems:  Review of Systems  Constitutional:  Negative for chills, fever and weight loss.       Weight gain, increase stress  HENT:  Negative for tinnitus.   Respiratory:  Negative for cough, sputum production and shortness of breath.   Cardiovascular:  Negative for chest pain, palpitations and leg swelling.  Gastrointestinal:  Negative for abdominal pain, constipation, diarrhea and heartburn.  Genitourinary:  Negative for dysuria, frequency and urgency.  Musculoskeletal:  Negative for back pain, falls, joint pain and myalgias.  Skin: Negative.   Neurological:  Negative for dizziness and headaches.  Psychiatric/Behavioral:  Negative for depression and memory loss. The patient does not have insomnia.     Past Medical History:  Diagnosis Date   Anxiety    Diabetes (HCC)    High cholesterol    Low testosterone in male    Overweight    Past Surgical History:  Procedure Laterality Date   COLONOSCOPY  2018   None     Social History:   reports that he has quit smoking. His smoking use included cigarettes. He has a 15.00 pack-year smoking history. He has never used smokeless tobacco. He reports that he does not currently use alcohol. He reports that he does not use drugs.  Family History  Problem Relation Age of  Onset   Stroke Mother    Diabetes Mother    Stroke Father     Medications: Patient's Medications  New Prescriptions   No medications on file  Previous Medications   DIAZEPAM (VALIUM) 5 MG TABLET    Take 1 tablet (5 mg total) by mouth 2 (two) times daily as needed.   DIPHENHYDRAMINE HCL, SLEEP, (SLEEP AID) 50 MG CAPS    Take 1 capsule by mouth at bedtime.   DULAGLUTIDE (TRULICITY) 0.75 MG/0.5ML SOPN    Inject 0.75 mg into the skin once a week.   ROSUVASTATIN (CRESTOR) 40 MG TABLET    Take 1 tablet (40 mg total) by mouth daily.   SILDENAFIL (VIAGRA) 100 MG TABLET    TAKE 1 TABLET BY MOUTH AS NEEDED. FOR ERECTILE DYSFUNCTION.   TESTOSTERONE CYPIONATE (DEPOTESTOTERONE CYPIONATE) 100 MG/ML INJECTION    Inject 100 mg into the muscle once a week.  Modified Medications   No medications on file  Discontinued Medications   No medications on file    Physical Exam:  There were no vitals filed for this visit. There is no height or weight on file to calculate BMI. Wt Readings from Last 3 Encounters:  04/13/21 200 lb 12.8 oz (91.1 kg)  11/01/20 191 lb 6.4 oz (86.8 kg)  06/16/20 200 lb (90.7 kg)    Physical Exam Constitutional:      Appearance: Normal appearance.  Neurological:  Mental Status: He is alert. Mental status is at baseline.  Psychiatric:        Mood and Affect: Mood normal.     Labs reviewed: Basic Metabolic Panel: Recent Labs    11/01/20 0000 12/13/20 0806 04/13/21 1541  NA 136 138 138  K 4.4 4.2 4.1  CL 102 103 102  CO2 24 25 24   GLUCOSE 89 100* 85  BUN 19 22 21   CREATININE 0.89 0.92 0.85  CALCIUM 9.4 9.1 9.4   Liver Function Tests: Recent Labs    11/01/20 0000 12/13/20 0806 04/13/21 1541  AST 15 26 24   ALT 13 35 27  BILITOT 0.8 0.5 0.7  PROT 7.8 7.4 7.9   No results for input(s): "LIPASE", "AMYLASE" in the last 8760 hours. No results for input(s): "AMMONIA" in the last 8760 hours. CBC: Recent Labs    11/02/20 0932 04/13/21 1541  WBC 5.6  6.5  NEUTROABS 3,433 3,634  HGB 17.1 16.2  HCT 52.2* 49.7  MCV 83.3 83.5  PLT 143 157   Lipid Panel: Recent Labs    11/01/20 0000 12/13/20 0806 04/13/21 1541  CHOL 234* 103 114  HDL 32* 34* 30*  LDLCALC 172* 49 60  TRIG 158* 112 163*  CHOLHDL 7.3* 3.0 3.8   TSH: No results for input(s): "TSH" in the last 8760 hours. A1C: Lab Results  Component Value Date   HGBA1C 5.7 (H) 04/13/2021     Assessment/Plan 1. Type 2 diabetes mellitus with other specified complication, without long-term current use of insulin (HCC) -discussed importance to dietary modifications as well as increase in physical activity to help control blood sugars. Pt wanting to change trulicity to omezpic to see if he can lose weight. He has lost weight in the past but now gaining. Will change medication but stressed the importance of lifestyle changes as well.  -to stop trulicity when starting ozempic - Semaglutide,0.25 or 0.5MG /DOS, (OZEMPIC, 0.25 OR 0.5 MG/DOSE,) 2 MG/3ML SOPN; 0.5 mg weekly injection for diabetes  Dispense: 3 mL; Refill: 1  2. Class 1 obesity due to excess calories with serious comorbidity and body mass index (BMI) of 30.0 to 30.9 in adult -discussed importance of dietary plan, calorie and carb reduction with increase in protein. Making sure to increase physical activity regularly and having an exercise plan - Semaglutide,0.25 or 0.5MG /DOS, (OZEMPIC, 0.25 OR 0.5 MG/DOSE,) 2 MG/3ML SOPN; 0.5 mg weekly injection for diabetes  Dispense: 3 mL; Refill: 1  Next appt: follow up a1c and appt in 3 months.  13/02/22. 06/13/21  [facility] [phone]    Virtual Visit via mychart video  I connected with patient on 08/02/21 at 10:40 AM EDT by video and verified that I am speaking with the correct person using two identifiers.  Location: Patient: home Provider: [place]    I discussed the limitations, risks, security and privacy concerns of performing an  evaluation and management service by telephone and the availability of in person appointments. I also discussed with the patient that there may be a patient responsible charge related to this service. The patient expressed understanding and agreed to proceed.   I discussed the assessment and treatment plan with the patient. The patient was provided an opportunity to ask questions and all were answered. The patient agreed with the plan and demonstrated an understanding of the instructions.   The patient was advised to call back or seek an in-person evaluation if the symptoms worsen or if the  condition fails to improve as anticipated.  I provided 18 minutes of non-face-to-face time during this encounter.  Janene Harvey. Biagio Borg Avs printed and mailed

## 2021-08-02 NOTE — Progress Notes (Signed)
This service is provided via telemedicine  No vital signs collected/recorded due to the encounter was a telemedicine visit.   Location of patient (ex: home, work):  home  Patient consents to a telephone visit:  Yes  Location of the provider (ex: office, home):  [facility]  Name of any referring provider:  Abbey Chatters, NP  Names of all persons participating in the telemedicine service and their role in the encounter:  Blessing Boruch, Manuele Harlow Basley, CMA and Abbey Chatters, NP  Time spent on call:  10:20minutes

## 2021-08-02 NOTE — Telephone Encounter (Signed)
Mr. gemini, beaumier are scheduled for a virtual visit with your provider today.    Just as we do with appointments in the office, we must obtain your consent to participate.  Your consent will be active for this visit and any virtual visit you Sanjith Siwek have with one of our providers in the next 365 days.    If you have a MyChart account, I can also send a copy of this consent to you electronically.  All virtual visits are billed to your insurance company just like a traditional visit in the office.  As this is a virtual visit, video technology does not allow for your provider to perform a traditional examination.  This Livian Vanderbeck limit your provider's ability to fully assess your condition.  If your provider identifies any concerns that need to be evaluated in person or the need to arrange testing such as labs, EKG, etc, we will make arrangements to do so.    Although advances in technology are sophisticated, we cannot ensure that it will always work on either your end or our end.  If the connection with a video visit is poor, we Sylvester Minton have to switch to a telephone visit.  With either a video or telephone visit, we are not always able to ensure that we have a secure connection.   I need to obtain your verbal consent now.   Are you willing to proceed with your visit today?   Dolan Haselton has provided verbal consent on 08/02/2021 for a virtual visit (video or telephone).   MayBeckey Downing, [place] 08/02/2021  10:34 AM

## 2021-09-13 ENCOUNTER — Other Ambulatory Visit: Payer: Self-pay | Admitting: Nurse Practitioner

## 2021-09-13 DIAGNOSIS — E6609 Other obesity due to excess calories: Secondary | ICD-10-CM

## 2021-09-13 DIAGNOSIS — E1169 Type 2 diabetes mellitus with other specified complication: Secondary | ICD-10-CM

## 2021-10-12 ENCOUNTER — Other Ambulatory Visit: Payer: Self-pay

## 2021-10-12 DIAGNOSIS — E782 Mixed hyperlipidemia: Secondary | ICD-10-CM

## 2021-10-12 DIAGNOSIS — E1169 Type 2 diabetes mellitus with other specified complication: Secondary | ICD-10-CM

## 2021-10-16 ENCOUNTER — Other Ambulatory Visit: Payer: BC Managed Care – PPO

## 2021-10-19 ENCOUNTER — Telehealth: Payer: BC Managed Care – PPO | Admitting: Nurse Practitioner

## 2021-11-23 ENCOUNTER — Ambulatory Visit: Payer: BC Managed Care – PPO | Admitting: Nurse Practitioner

## 2021-11-23 ENCOUNTER — Ambulatory Visit (INDEPENDENT_AMBULATORY_CARE_PROVIDER_SITE_OTHER): Payer: BC Managed Care – PPO | Admitting: Nurse Practitioner

## 2021-11-23 ENCOUNTER — Encounter: Payer: Self-pay | Admitting: Nurse Practitioner

## 2021-11-23 VITALS — BP 136/88 | HR 86 | Temp 97.7°F | Ht 68.0 in | Wt 213.0 lb

## 2021-11-23 DIAGNOSIS — E1169 Type 2 diabetes mellitus with other specified complication: Secondary | ICD-10-CM | POA: Diagnosis not present

## 2021-11-23 DIAGNOSIS — E6609 Other obesity due to excess calories: Secondary | ICD-10-CM

## 2021-11-23 DIAGNOSIS — E782 Mixed hyperlipidemia: Secondary | ICD-10-CM | POA: Diagnosis not present

## 2021-11-23 DIAGNOSIS — E291 Testicular hypofunction: Secondary | ICD-10-CM | POA: Diagnosis not present

## 2021-11-23 DIAGNOSIS — Z6832 Body mass index (BMI) 32.0-32.9, adult: Secondary | ICD-10-CM

## 2021-11-23 DIAGNOSIS — Z23 Encounter for immunization: Secondary | ICD-10-CM | POA: Diagnosis not present

## 2021-11-23 DIAGNOSIS — R7989 Other specified abnormal findings of blood chemistry: Secondary | ICD-10-CM

## 2021-11-23 DIAGNOSIS — Z125 Encounter for screening for malignant neoplasm of prostate: Secondary | ICD-10-CM | POA: Diagnosis not present

## 2021-11-23 DIAGNOSIS — N529 Male erectile dysfunction, unspecified: Secondary | ICD-10-CM

## 2021-11-23 DIAGNOSIS — Z1212 Encounter for screening for malignant neoplasm of rectum: Secondary | ICD-10-CM

## 2021-11-23 DIAGNOSIS — Z1211 Encounter for screening for malignant neoplasm of colon: Secondary | ICD-10-CM

## 2021-11-23 MED ORDER — SILDENAFIL CITRATE 100 MG PO TABS: ORAL_TABLET | ORAL | 11 refills | Status: DC

## 2021-11-23 MED ORDER — SEMAGLUTIDE (1 MG/DOSE) 4 MG/3ML ~~LOC~~ SOPN: 1.0000 mg | PEN_INJECTOR | SUBCUTANEOUS | 2 refills | Status: DC

## 2021-11-23 NOTE — Progress Notes (Signed)
Careteam: Patient Care Team: [name], NP as PCP - General (Geriatric Medicine)  PLACE OF SERVICE:  [place]  Advanced Directive information    Allergies  Allergen Reactions   Penicillins Rash    Chief Complaint  Patient presents with   Medical Management of Chronic Issues    3 month follow-up. Discuss need for HIV screening, urine ACR, td/tdap, shingrix, covid booster, eye exam (per patient had within the last 6 months), flu vaccine and foot exam or post pone if patient refuses. NCIR verified. Discuss Ozempic. Discuss coding for A1c. Refill on viagra.      HPI: Patient is a 56 y.o. male for routine follow up.  He is on ozempic 0.5 mg weekly- he has gained weight on medication.  He is worried a1c will not be covered because lab made him sign but it has been  months since last check   He needs refill on ozempic. Would like higher dose due to weight gain. Suspect A1c is up.  He has been very motivated to lose weight and wants to make sure A1c controlled.  Drinking IPA every other day but has a low carb low sugar diet that is high in protein.  No low blood sugars.  Has a new job that he will be more active in .   Review of Systems:  Review of Systems  Constitutional:  Negative for chills, fever and weight loss.  HENT:  Negative for tinnitus.   Respiratory:  Negative for cough, sputum production and shortness of breath.   Cardiovascular:  Negative for chest pain, palpitations and leg swelling.  Gastrointestinal:  Negative for abdominal pain, constipation, diarrhea and heartburn.  Genitourinary:  Negative for dysuria, frequency and urgency.  Musculoskeletal:  Negative for back pain, falls, joint pain and myalgias.  Skin: Negative.   Neurological:  Negative for dizziness and headaches.  Psychiatric/Behavioral:  Negative for depression and memory loss. The patient does not have insomnia.    Past Medical History:  Diagnosis Date   Anxiety    Diabetes ([place])     High cholesterol    Low testosterone in male    Overweight    Past Surgical History:  Procedure Laterality Date   COLONOSCOPY  2018   IRIDOTOMY / IRIDECTOMY Bilateral 11/11/2020   Dr.Groat   None     Social History:   reports that he quit smoking about 21 months ago. His smoking use included cigarettes. He has a 15.00 pack-year smoking history. He has never used smokeless tobacco. He reports current alcohol use. He reports that he does not use drugs.  Family History  Problem Relation Age of Onset   Stroke Mother    Diabetes Mother    Stroke Father     Medications: Patient's Medications  New Prescriptions   No medications on file  Previous Medications   DIAZEPAM (VALIUM) 5 MG TABLET    Take 1 tablet (5 mg total) by mouth 2 (two) times daily as needed.   DIPHENHYDRAMINE HCL, SLEEP, (SLEEP AID) 50 MG CAPS    Take 1 capsule by mouth at bedtime.   IBUPROFEN (ADVIL) 200 MG TABLET    Take 200 mg by mouth as needed.   ROSUVASTATIN (CRESTOR) 40 MG TABLET    Take 1 tablet (40 mg total) by mouth daily.   SEMAGLUTIDE,0.25 OR 0.5MG /DOS, (OZEMPIC, 0.25 OR 0.5 MG/DOSE,) 2 MG/3ML SOPN    INJECT 0.5 MG INTO THE SKIN WEEKLY FOR DIABETES   SILDENAFIL (VIAGRA) 100 MG  TABLET    TAKE 1 TABLET BY MOUTH AS NEEDED. FOR ERECTILE DYSFUNCTION.   TESTOSTERONE CYPIONATE (DEPOTESTOTERONE CYPIONATE) 100 MG/ML INJECTION    Inject 100 mg into the muscle once a week.  Modified Medications   No medications on file  Discontinued Medications   No medications on file    Physical Exam:  Vitals:   11/23/21 1425  BP: 136/88  Pulse: 86  Temp: 97.7 F (36.5 C)  TempSrc: Temporal  SpO2: 97%  Weight: 213 lb (96.6 kg)  Height: 5\' 8"  (1.727 m)   Body mass index is 32.39 kg/m. Wt Readings from Last 3 Encounters:  11/23/21 213 lb (96.6 kg)  04/13/21 200 lb 12.8 oz (91.1 kg)  11/01/20 191 lb 6.4 oz (86.8 kg)    Physical Exam Constitutional:      General: He is not in acute distress.    Appearance: He is  well-developed. He is not diaphoretic.  HENT:     Head: Normocephalic and atraumatic.     Right Ear: External ear normal.     Left Ear: External ear normal.     Mouth/Throat:     Pharynx: No oropharyngeal exudate.  Eyes:     Conjunctiva/sclera: Conjunctivae normal.     Pupils: Pupils are equal, round, and reactive to light.  Cardiovascular:     Rate and Rhythm: Normal rate and regular rhythm.     Heart sounds: Normal heart sounds.  Pulmonary:     Effort: Pulmonary effort is normal.     Breath sounds: Normal breath sounds.  Abdominal:     General: Bowel sounds are normal.     Palpations: Abdomen is soft.  Musculoskeletal:        General: No tenderness.     Cervical back: Normal range of motion and neck supple.     Right lower leg: No edema.     Left lower leg: No edema.  Skin:    General: Skin is warm and dry.  Neurological:     Mental Status: He is alert and oriented to person, place, and time.     Labs reviewed: Basic Metabolic Panel: Recent Labs    12/13/20 0806 04/13/21 1541  NA 138 138  K 4.2 4.1  CL 103 102  CO2 25 24  GLUCOSE 100* 85  BUN 22 21  CREATININE 0.92 0.85  CALCIUM 9.1 9.4   Liver Function Tests: Recent Labs    12/13/20 0806 04/13/21 1541  AST 26 24  ALT 35 27  BILITOT 0.5 0.7  PROT 7.4 7.9   No results for input(s): "LIPASE", "AMYLASE" in the last 8760 hours. No results for input(s): "AMMONIA" in the last 8760 hours. CBC: Recent Labs    04/13/21 1541  WBC 6.5  NEUTROABS 3,634  HGB 16.2  HCT 49.7  MCV 83.5  PLT 157   Lipid Panel: Recent Labs    12/13/20 0806 04/13/21 1541  CHOL 103 114  HDL 34* 30*  LDLCALC 49 60  TRIG 112 163*  CHOLHDL 3.0 3.8   TSH: No results for input(s): "TSH" in the last 8760 hours. A1C: Lab Results  Component Value Date   HGBA1C 5.7 (H) 04/13/2021     Assessment/Plan 1. Type 2 diabetes mellitus with other specified complication, without long-term current use of insulin (HCC) -continues  on semaglutide, has gained weight despite being on medication and dietary modifications -will follow up A1c today. Continues on semaglutide, will increase dose at this time and continue to follow  -Encouraged  dietary compliance, routine foot care/monitoring and to keep up with diabetic eye exams through ophthalmology - Semaglutide, 1 MG/DOSE, 4 MG/3ML SOPN; Inject 1 mg as directed once a week.  Dispense: 3 mL; Refill: 2  2. Need for influenza vaccination - Flu Vaccine QUAD 6+ mos PF IM (Fluarix Quad PF)  3. Erectile dysfunction, unspecified erectile dysfunction type - sildenafil (VIAGRA) 100 MG tablet; TAKE 1 TABLET BY MOUTH AS NEEDED. FOR ERECTILE DYSFUNCTION.  Dispense: 6 tablet; Refill: 11  4. Encounter for colorectal cancer screening - Ambulatory referral to Gastroenterology  5. Class 1 obesity due to excess calories with serious comorbidity and body mass index (BMI) of 32.0 to 32.9 in adult --education provided on healthy weight loss through increase in physical activity and proper nutrition   6. Mixed hyperlipidemia -cotninues on crestor, lipid panel pending.   7. Low testosterone -continues on supplement, followed by urology  Return in about 6 months (around 05/25/2022) for routine follow up . Janene Harvey. Biagio Borg  [facility] [phone]

## 2021-11-23 NOTE — Progress Notes (Deleted)
   This service is provided via telemedicine  No vital signs collected/recorded due to the encounter was a telemedicine visit.   Location of patient (ex: home, work):  Home  Patient consents to a telephone visit: Yes, see telephone visit dated 08/02/2021  Location of the provider (ex: office, home):  [facility], Office   Name of any referring provider:  N/A  Names of all persons participating in the telemedicine service and their role in the encounter:  S.Chrae B/CMA, Sherrie Mustache, NP, and Patient   Time spent on call:  11 min with medical assistant

## 2021-11-24 LAB — COMPLETE METABOLIC PANEL WITH GFR
AG Ratio: 1.5 (calc) (ref 1.0–2.5)
ALT: 44 U/L (ref 9–46)
AST: 33 U/L (ref 10–35)
Albumin: 4.6 g/dL (ref 3.6–5.1)
Alkaline phosphatase (APISO): 63 U/L (ref 35–144)
BUN: 16 mg/dL (ref 7–25)
CO2: 26 mmol/L (ref 20–32)
Calcium: 8.9 mg/dL (ref 8.6–10.3)
Chloride: 105 mmol/L (ref 98–110)
Creat: 0.84 mg/dL (ref 0.70–1.30)
Globulin: 3.1 g/dL (calc) (ref 1.9–3.7)
Glucose, Bld: 96 mg/dL (ref 65–139)
Potassium: 4.1 mmol/L (ref 3.5–5.3)
Sodium: 138 mmol/L (ref 135–146)
Total Bilirubin: 0.8 mg/dL (ref 0.2–1.2)
Total Protein: 7.7 g/dL (ref 6.1–8.1)
eGFR: 103 mL/min/{1.73_m2} (ref >=60)

## 2021-11-24 LAB — LIPID PANEL
Cholesterol: 99 mg/dL (ref <200)
HDL: 32 mg/dL — ABNORMAL LOW (ref >=40)
LDL Cholesterol (Calc): 47 mg/dL (calc)
Non-HDL Cholesterol (Calc): 67 mg/dL (calc) (ref <130)
Total CHOL/HDL Ratio: 3.1 (calc) (ref <5.0)
Triglycerides: 122 mg/dL (ref <150)

## 2021-11-24 LAB — CBC WITH DIFFERENTIAL/PLATELET
Absolute Monocytes: 311 cells/uL (ref 200–950)
Basophils Absolute: 50 cells/uL (ref 0–200)
Basophils Relative: 1.1 %
Eosinophils Absolute: 248 cells/uL (ref 15–500)
Eosinophils Relative: 5.5 %
HCT: 47.1 % (ref 38.5–50.0)
Hemoglobin: 15.8 g/dL (ref 13.2–17.1)
Lymphs Abs: 1377 cells/uL (ref 850–3900)
MCH: 28.1 pg (ref 27.0–33.0)
MCHC: 33.5 g/dL (ref 32.0–36.0)
MCV: 83.8 fL (ref 80.0–100.0)
MPV: 8.8 fL (ref 7.5–12.5)
Monocytes Relative: 6.9 %
Neutro Abs: 2516 cells/uL (ref 1500–7800)
Neutrophils Relative %: 55.9 %
Platelets: 113 10*3/uL — ABNORMAL LOW (ref 140–400)
RBC: 5.62 10*6/uL (ref 4.20–5.80)
RDW: 13.2 % (ref 11.0–15.0)
Total Lymphocyte: 30.6 %
WBC: 4.5 10*3/uL (ref 3.8–10.8)

## 2021-11-24 LAB — HEMOGLOBIN A1C
Hgb A1c MFr Bld: 5.8 % of total Hgb — ABNORMAL HIGH (ref <5.7)
Mean Plasma Glucose: 120 mg/dL
eAG (mmol/L): 6.6 mmol/L

## 2021-11-26 ENCOUNTER — Telehealth: Payer: Self-pay | Admitting: Adult Health

## 2021-11-26 ENCOUNTER — Other Ambulatory Visit: Payer: Self-pay

## 2021-11-26 DIAGNOSIS — F411 Generalized anxiety disorder: Secondary | ICD-10-CM

## 2021-11-26 MED ORDER — DIAZEPAM 5 MG PO TABS: 5.0000 mg | ORAL_TABLET | Freq: Two times a day (BID) | ORAL | 2 refills | Status: DC | PRN

## 2021-11-26 NOTE — Telephone Encounter (Signed)
Pended.

## 2021-11-26 NOTE — Telephone Encounter (Signed)
Pt has an appointment on 10/19. He needs a refill on his valium. The pharmacy is [facility] on [address] in [place]

## 2021-11-28 ENCOUNTER — Telehealth (INDEPENDENT_AMBULATORY_CARE_PROVIDER_SITE_OTHER): Payer: BC Managed Care – PPO | Admitting: Adult Health

## 2021-11-28 ENCOUNTER — Encounter: Payer: Self-pay | Admitting: Adult Health

## 2021-11-28 DIAGNOSIS — F331 Major depressive disorder, recurrent, moderate: Secondary | ICD-10-CM | POA: Diagnosis not present

## 2021-11-28 DIAGNOSIS — F411 Generalized anxiety disorder: Secondary | ICD-10-CM

## 2021-11-28 NOTE — Progress Notes (Signed)
James Woods [id number] 03-01-1965 56 y.o.  Virtual Visit via Video Note  I connected with pt @ on 11/28/21 at  1:40 PM EDT by a video enabled telemedicine application and verified that I am speaking with the correct person using two identifiers.   I discussed the limitations of evaluation and management by telemedicine and the availability of in person appointments. The patient expressed understanding and agreed to proceed.  I discussed the assessment and treatment plan with the patient. The patient was provided an opportunity to ask questions and all were answered. The patient agreed with the plan and demonstrated an understanding of the instructions.   The patient was advised to call back or seek an in-person evaluation if the symptoms worsen or if the condition fails to improve as anticipated.  I provided 10 minutes of non-face-to-face time during this encounter.  The patient was located at home.  The provider was located at [facility].   Dorothyann Gibbs, NP   Subjective:   Patient ID:  James Woods is a 56 y.o. (DOB 1965-06-25) male.  Chief Complaint: No chief complaint on file.   HPI James Woods presents for follow-up of MDD and GAD.   Describes mood today as "ok". Pleasant. Denies tearfulness. Mood symptoms - reports some depression depression and anxiety at times. Denies irritability. Mood is consistent. Stating "I'm doing ok". Feels like the Valium continues to work well for him. Stable interest and motivation. Taking medications as prescribed. Energy levels stable. Active, does not have a regular exercise routine.   Enjoys some usual interests and activities. Married. Separated. Has 2 grown children - daughters 4 and 52. Spending time with family. Appetite adequate. Weight gain 198 to 212 pounds.  Sleeps well most nights. Averages 6.5 hours. Focus and concentration stable. Completing tasks. Managing aspects of household. Works full-time - [employer]. Denies SI or HI.  Denies AH or VH. Denies substance use.  Previous medications: Lamictal    Review of Systems:  Review of Systems  Musculoskeletal:  Negative for gait problem.  Neurological:  Negative for tremors.  Psychiatric/Behavioral:         Please refer to HPI    Medications: I have reviewed the patient's current medications.  Current Outpatient Medications  Medication Sig Dispense Refill   diazepam (VALIUM) 5 MG tablet Take 1 tablet (5 mg total) by mouth 2 (two) times daily as needed. 60 tablet 2   diphenhydrAMINE HCl, Sleep, (SLEEP AID) 50 MG CAPS Take 1 capsule by mouth at bedtime.     ibuprofen (ADVIL) 200 MG tablet Take 200 mg by mouth as needed.     rosuvastatin (CRESTOR) 40 MG tablet Take 1 tablet (40 mg total) by mouth daily. 90 tablet 3   Semaglutide, 1 MG/DOSE, 4 MG/3ML SOPN Inject 1 mg as directed once a week. 3 mL 2   sildenafil (VIAGRA) 100 MG tablet TAKE 1 TABLET BY MOUTH AS NEEDED. FOR ERECTILE DYSFUNCTION. 6 tablet 11   testosterone cypionate (DEPOTESTOTERONE CYPIONATE) 100 MG/ML injection Inject 100 mg into the muscle once a week.     No current facility-administered medications for this visit.    Medication Side Effects: None  Allergies:  Allergies  Allergen Reactions   Penicillins Rash    Past Medical History:  Diagnosis Date   Anxiety    Diabetes (HCC)    High cholesterol    Low testosterone in male    Overweight     Family History  Problem Relation Age of  Onset   Stroke Mother    Diabetes Mother    Stroke Father     Social History   Socioeconomic History   Marital status: Married    Spouse name: Not on file   Number of children: Not on file   Years of education: Not on file   Highest education level: Not on file  Occupational History   Not on file  Tobacco Use   Smoking status: Former    Packs/day: 0.50    Years: 30.00    Total pack years: 15.00    Types: Cigarettes    Quit date: 02/12/2020    Years since  quitting: 1.7   Smokeless tobacco: Never  Vaping Use   Vaping Use: Some days   Start date: 07/12/2020  Substance and Sexual Activity   Alcohol use: Yes    Comment: occasionally   Drug use: Never   Sexual activity: Not on file  Other Topics Concern   Not on file  Social History Narrative   Tobacco use:  1/2 pack per day, 30 years   Alcohol use: None   Diet:     Do you drink/eat things with caffeine?  No.   Marital status: Married in 1993.    Do you live in a house, apartment, assisted living, condo, trailer, etc.)? House   Is it one or more stories? One story.   How many persons live in your home?  2   Do you have any pets in your home? (please list)  3 dogs   Highest level of education: - Some college.   Current or past profession:  Retail   Do you exercise:  No.       Advanced Directive   No Living Will, no DNR, no POA/HPOA      FUNCTIONAL STATUS   No difficulty with bathing, dressing, preparing food, eating, managing medications, finances. No difficulty with affording medications.    Social Determinants of Health   Financial Resource Strain: Not on file  Food Insecurity: Not on file  Transportation Needs: Not on file  Physical Activity: Not on file  Stress: Not on file  Social Connections: Not on file  Intimate Partner Violence: Not on file    Past Medical History, Surgical history, Social history, and Family history were reviewed and updated as appropriate.   Please see review of systems for further details on the patient's review from today.   Objective:   Physical Exam:  There were no vitals taken for this visit.  Physical Exam Constitutional:      General: He is not in acute distress. Musculoskeletal:        General: No deformity.  Neurological:     Mental Status: He is alert and oriented to person, place, and time.     Coordination: Coordination normal.  Psychiatric:        Attention and Perception: Attention and perception normal. He does not  perceive auditory or visual hallucinations.        Mood and Affect: Mood normal. Mood is not anxious or depressed. Affect is not labile, blunt, angry or inappropriate.        Speech: Speech normal.        Behavior: Behavior normal.        Thought Content: Thought content normal. Thought content is not paranoid or delusional. Thought content does not include homicidal or suicidal ideation. Thought content does not include homicidal or suicidal plan.        Cognition and  Memory: Cognition and memory normal.        Judgment: Judgment normal.     Comments: Insight intact     Lab Review:     Component Value Date/Time   NA 138 11/23/2021 1407   K 4.1 11/23/2021 1407   CL 105 11/23/2021 1407   CO2 26 11/23/2021 1407   GLUCOSE 96 11/23/2021 1407   BUN 16 11/23/2021 1407   CREATININE 0.84 11/23/2021 1407   CALCIUM 8.9 11/23/2021 1407   PROT 7.7 11/23/2021 1407   AST 33 11/23/2021 1407   ALT 44 11/23/2021 1407   BILITOT 0.8 11/23/2021 1407   GFRNONAA 84 06/01/2020 0813   GFRAA 97 06/01/2020 0813       Component Value Date/Time   WBC 4.5 11/23/2021 1407   RBC 5.62 11/23/2021 1407   HGB 15.8 11/23/2021 1407   HCT 47.1 11/23/2021 1407   PLT 113 (L) 11/23/2021 1407   MCV 83.8 11/23/2021 1407   MCH 28.1 11/23/2021 1407   MCHC 33.5 11/23/2021 1407   RDW 13.2 11/23/2021 1407   LYMPHSABS 1,377 11/23/2021 1407   EOSABS 248 11/23/2021 1407   BASOSABS 50 11/23/2021 1407    No results found for: "POCLITH", "LITHIUM"   No results found for: "PHENYTOIN", "PHENOBARB", "VALPROATE", "CBMZ"   .res Assessment: Plan:    Plan:  PDMP reviewed  Valium 5mg  twice daily - take 1/2 tablet up to 4 times.  RTC 3 months  Patient advised to contact office with any questions, adverse effects, or acute worsening in signs and symptoms.  Discussed potential benefits, risk, and side effects of benzodiazepines to include potential risk of tolerance and dependence, as well as possible drowsiness.   Advised patient not to drive if experiencing drowsiness and to take lowest possible effective dose to minimize risk of dependence and tolerance.   Diagnoses and all orders for this visit:  Generalized anxiety disorder  Major depressive disorder, recurrent episode, moderate ([place])     Please see After Visit Summary for patient specific instructions.  Future Appointments  Date Time Provider [place]  05/24/2022  3:20 PM [name], NP [facility] None    No orders of the defined types were placed in this encounter.     -------------------------------

## 2021-12-06 ENCOUNTER — Other Ambulatory Visit: Payer: Self-pay | Admitting: Nurse Practitioner

## 2021-12-06 DIAGNOSIS — E782 Mixed hyperlipidemia: Secondary | ICD-10-CM

## 2021-12-14 DIAGNOSIS — N5201 Erectile dysfunction due to arterial insufficiency: Secondary | ICD-10-CM | POA: Diagnosis not present

## 2021-12-14 DIAGNOSIS — E291 Testicular hypofunction: Secondary | ICD-10-CM | POA: Diagnosis not present

## 2022-01-31 ENCOUNTER — Other Ambulatory Visit: Payer: Self-pay | Admitting: Adult Health

## 2022-01-31 DIAGNOSIS — F411 Generalized anxiety disorder: Secondary | ICD-10-CM

## 2022-02-01 NOTE — Telephone Encounter (Signed)
Filled 10/16 appt 1/17

## 2022-02-20 ENCOUNTER — Other Ambulatory Visit: Payer: Self-pay | Admitting: Nurse Practitioner

## 2022-02-20 DIAGNOSIS — E1169 Type 2 diabetes mellitus with other specified complication: Secondary | ICD-10-CM

## 2022-02-27 ENCOUNTER — Telehealth (INDEPENDENT_AMBULATORY_CARE_PROVIDER_SITE_OTHER): Payer: Self-pay | Admitting: Adult Health

## 2022-02-27 DIAGNOSIS — F489 Nonpsychotic mental disorder, unspecified: Secondary | ICD-10-CM

## 2022-02-27 NOTE — Progress Notes (Signed)
Patient no show appointment. ? ?

## 2022-04-12 IMAGING — CR DG CHEST 2V
2 series · 2 of 2 positions shown · non-contrast
Comparison: None.

CLINICAL DATA: Cough, left back pain

EXAM:
CHEST - 2 VIEW

[w chest pa]
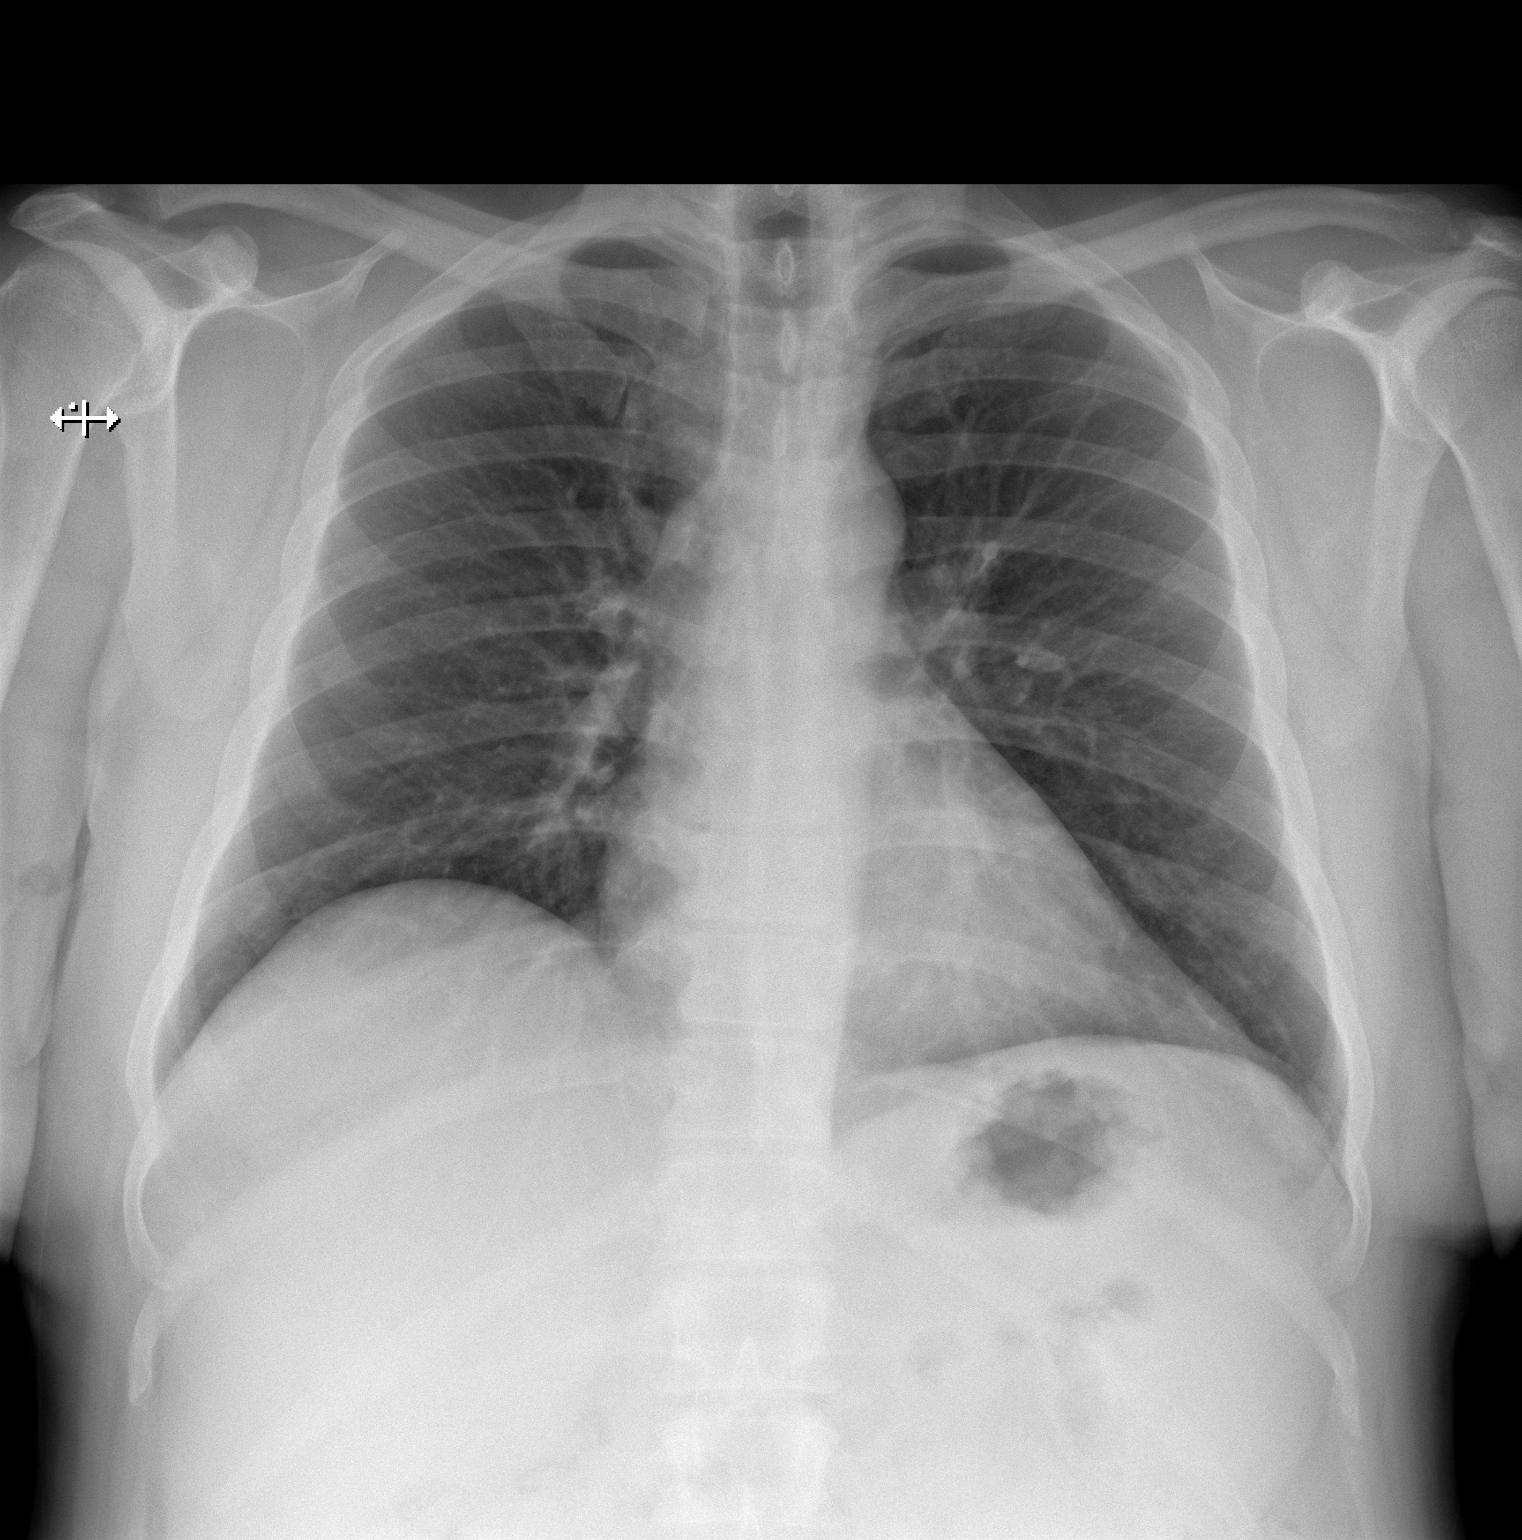

[w chest lat]
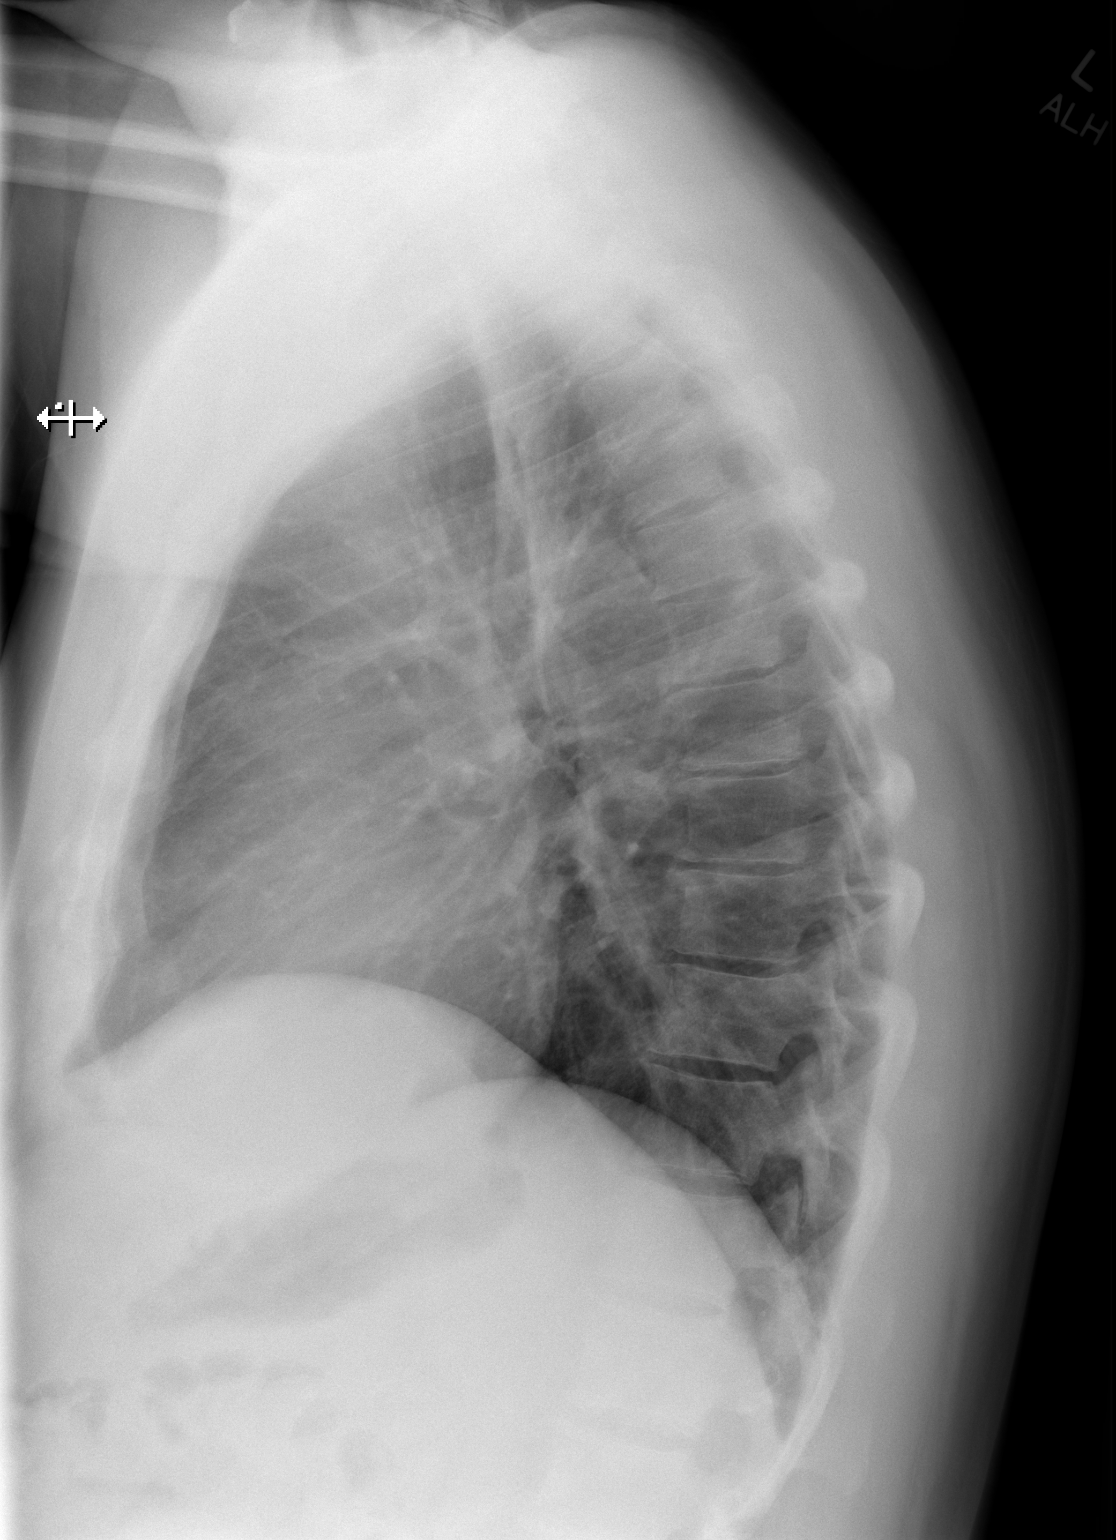

[2 of 2 positions shown; findings below may reference images not displayed]

FINDINGS: The heart size and mediastinal contours are within normal limits.
Both lungs are clear. The visualized skeletal structures are
unremarkable.
IMPRESSION: Negative.

## 2022-05-21 ENCOUNTER — Other Ambulatory Visit: Payer: Self-pay | Admitting: Nurse Practitioner

## 2022-05-21 DIAGNOSIS — E1169 Type 2 diabetes mellitus with other specified complication: Secondary | ICD-10-CM

## 2022-05-24 ENCOUNTER — Encounter: Payer: Self-pay | Admitting: Nurse Practitioner

## 2022-05-24 ENCOUNTER — Ambulatory Visit (INDEPENDENT_AMBULATORY_CARE_PROVIDER_SITE_OTHER): Payer: BC Managed Care – PPO | Admitting: Nurse Practitioner

## 2022-05-24 VITALS — BP 124/92 | HR 86 | Temp 97.7°F | Ht 68.0 in | Wt 217.0 lb

## 2022-05-24 DIAGNOSIS — E1169 Type 2 diabetes mellitus with other specified complication: Secondary | ICD-10-CM

## 2022-05-24 DIAGNOSIS — N529 Male erectile dysfunction, unspecified: Secondary | ICD-10-CM | POA: Diagnosis not present

## 2022-05-24 DIAGNOSIS — F324 Major depressive disorder, single episode, in partial remission: Secondary | ICD-10-CM | POA: Diagnosis not present

## 2022-05-24 DIAGNOSIS — Z1211 Encounter for screening for malignant neoplasm of colon: Secondary | ICD-10-CM

## 2022-05-24 DIAGNOSIS — E782 Mixed hyperlipidemia: Secondary | ICD-10-CM | POA: Diagnosis not present

## 2022-05-24 DIAGNOSIS — E6609 Other obesity due to excess calories: Secondary | ICD-10-CM

## 2022-05-24 DIAGNOSIS — Z9109 Other allergy status, other than to drugs and biological substances: Secondary | ICD-10-CM

## 2022-05-24 DIAGNOSIS — Z1212 Encounter for screening for malignant neoplasm of rectum: Secondary | ICD-10-CM

## 2022-05-24 DIAGNOSIS — Z6832 Body mass index (BMI) 32.0-32.9, adult: Secondary | ICD-10-CM

## 2022-05-24 MED ORDER — TRULICITY 0.75 MG/0.5ML ~~LOC~~ SOAJ
0.7500 mg | SUBCUTANEOUS | 1 refills | Status: DC
Start: 2022-05-24 — End: 2022-07-17

## 2022-05-24 MED ORDER — ROSUVASTATIN CALCIUM 40 MG PO TABS: 40.0000 mg | ORAL_TABLET | Freq: Every day | ORAL | 1 refills | Status: DC

## 2022-05-24 MED ORDER — SILDENAFIL CITRATE 100 MG PO TABS: ORAL_TABLET | ORAL | 11 refills | Status: DC

## 2022-05-24 NOTE — Patient Instructions (Signed)
To use PLAIN claritin or zyrtec- do not use D- this will make blood pressure high.

## 2022-05-24 NOTE — Progress Notes (Signed)
Careteam: Patient Care Team: Sharon Seller, NP as PCP - General (Geriatric Medicine)  PLACE OF SERVICE:  [facility]  Advanced Directive information Does Patient Have a Medical Advance Directive?: No, Would patient like information on creating a medical advance directive?: No - Patient declined  Allergies  Allergen Reactions   Penicillins Rash    Chief Complaint  Patient presents with   Medical Management of Chronic Issues    6 month follow-up. Discuss need for HIV screening, diabetic kidney evaluation, td/tdap, shingrix, eye exam (result request sent to [facility] care)  and covid booster. [facility] verified.    Med Change Request    Requesting to change Ozempic back to trulicity      HPI: Patient is a 57 y.o. male for routine follow up.  Has been drinking 1 beer a day which is a lifestyle change- IPA.  Also has gained weight Does not like ozempic- feels like it has not done good for his stomach.  He is not eating a lot of carbs.  Walks for exercise.   Increase in stress- went through a divorce. Overall mood is stable.   Having increase in allergies.   Review of Systems:  Review of Systems  Constitutional:  Negative for chills, fever and weight loss.  HENT:  Negative for tinnitus.   Respiratory:  Negative for cough, sputum production and shortness of breath.   Cardiovascular:  Negative for chest pain, palpitations and leg swelling.  Gastrointestinal:  Negative for abdominal pain, constipation, diarrhea and heartburn.  Genitourinary:  Negative for dysuria, frequency and urgency.  Musculoskeletal:  Negative for back pain, falls, joint pain and myalgias.  Skin: Negative.   Neurological:  Negative for dizziness and headaches.  Psychiatric/Behavioral:  Negative for depression and memory loss. The patient does not have insomnia.     Past Medical History:  Diagnosis Date   Anxiety    Diabetes    High cholesterol    Low testosterone in male    Overweight    Past  Surgical History:  Procedure Laterality Date   COLONOSCOPY  2018   IRIDOTOMY / IRIDECTOMY Bilateral 11/11/2020   Dr.Groat   None     Social History:   reports that he quit smoking about 2 years ago. His smoking use included cigarettes. He has a 15.00 pack-year smoking history. He has never used smokeless tobacco. He reports current alcohol use of about 7.0 standard drinks of alcohol per week. He reports that he does not use drugs.  Family History  Problem Relation Age of Onset   Stroke Mother    Diabetes Mother    Stroke Father     Medications: Patient's Medications  New Prescriptions   No medications on file  Previous Medications   DIAZEPAM (VALIUM) 5 MG TABLET    TAKE 1 TABLET BY MOUTH 2 TIMES DAILY AS NEEDED.   DIPHENHYDRAMINE HCL, SLEEP, (SLEEP AID) 50 MG CAPS    Take 1 capsule by mouth at bedtime.   IBUPROFEN (ADVIL) 200 MG TABLET    Take 200 mg by mouth as needed.   OZEMPIC, 1 MG/DOSE, 4 MG/3ML SOPN    INJECT 1 MG ONCE WEEKLY   ROSUVASTATIN (CRESTOR) 40 MG TABLET    TAKE 1 TABLET BY MOUTH EVERY DAY   SILDENAFIL (VIAGRA) 100 MG TABLET    TAKE 1 TABLET BY MOUTH AS NEEDED. FOR ERECTILE DYSFUNCTION.   TESTOSTERONE CYPIONATE (DEPOTESTOTERONE CYPIONATE) 100 MG/ML INJECTION    Inject 100 mg into the muscle  once a week.  Modified Medications   No medications on file  Discontinued Medications   No medications on file    Physical Exam:  Vitals:   05/24/22 1525 05/24/22 1527  BP: (!) 126/90 (!) 124/92  Pulse: 86   Temp: 97.7 F (36.5 C)   TempSrc: Temporal   SpO2: 96%   Weight: 217 lb (98.4 kg)   Height: 5\' 8"  (1.727 m)    Body mass index is 32.99 kg/m. Wt Readings from Last 3 Encounters:  05/24/22 217 lb (98.4 kg)  11/23/21 213 lb (96.6 kg)  04/13/21 200 lb 12.8 oz (91.1 kg)    Physical Exam Constitutional:      General: He is not in acute distress.    Appearance: He is well-developed. He is not diaphoretic.  HENT:     Head: Normocephalic and atraumatic.      Right Ear: External ear normal.     Left Ear: External ear normal.     Mouth/Throat:     Pharynx: No oropharyngeal exudate.  Eyes:     Conjunctiva/sclera: Conjunctivae normal.     Pupils: Pupils are equal, round, and reactive to light.  Cardiovascular:     Rate and Rhythm: Normal rate and regular rhythm.     Heart sounds: Normal heart sounds.  Pulmonary:     Effort: Pulmonary effort is normal.     Breath sounds: Normal breath sounds.  Abdominal:     General: Bowel sounds are normal.     Palpations: Abdomen is soft.  Musculoskeletal:        General: No tenderness.     Cervical back: Normal range of motion and neck supple.     Right lower leg: No edema.     Left lower leg: No edema.  Skin:    General: Skin is warm and dry.  Neurological:     Mental Status: He is alert and oriented to person, place, and time.     Labs reviewed: Basic Metabolic Panel: Recent Labs    11/23/21 1407  NA 138  K 4.1  CL 105  CO2 26  GLUCOSE 96  BUN 16  CREATININE 0.84  CALCIUM 8.9   Liver Function Tests: Recent Labs    11/23/21 1407  AST 33  ALT 44  BILITOT 0.8  PROT 7.7   No results for input(s): "LIPASE", "AMYLASE" in the last 8760 hours. No results for input(s): "AMMONIA" in the last 8760 hours. CBC: Recent Labs    11/23/21 1407  WBC 4.5  NEUTROABS 2,516  HGB 15.8  HCT 47.1  MCV 83.8  PLT 113*   Lipid Panel: Recent Labs    11/23/21 1407  CHOL 99  HDL 32*  LDLCALC 47  TRIG 326  CHOLHDL 3.1   TSH: No results for input(s): "TSH" in the last 8760 hours. A1C: Lab Results  Component Value Date   HGBA1C 5.8 (H) 11/23/2021     Assessment/Plan 1. Mixed hyperlipidemia -dietary modifications encouraged - rosuvastatin (CRESTOR) 40 MG tablet; Take 1 tablet (40 mg total) by mouth daily.  Dispense: 90 tablet; Refill: 1 - Lipid panel - COMPLETE METABOLIC PANEL WITH GFR  2. Type 2 diabetes mellitus with other specified complication, without long-term current use of  insulin -Encouraged dietary compliance, routine foot care/monitoring and to keep up with diabetic eye exams through ophthalmology  -request to stop ozempeic and restart trulicity  - COMPLETE METABOLIC PANEL WITH GFR - CBC with Differential/Platelet - Hemoglobin A1c - Microalbumin/Creatinine Ratio, Urine -  Dulaglutide (TRULICITY) 0.75 MG/0.5ML SOPN; Inject 0.75 mg into the skin once a week.  Dispense: 3 mL; Refill: 1  3. Depression, major, single episode, in partial remission Controlled at this time.   4. Erectile dysfunction, unspecified erectile dysfunction type - sildenafil (VIAGRA) 100 MG tablet; TAKE 1 TABLET BY MOUTH AS NEEDED. FOR ERECTILE DYSFUNCTION.  Dispense: 6 tablet; Refill: 11  5. Encounter for colorectal cancer screening - Ambulatory referral to Gastroenterology  6. Environmental allergies -To take Loratadine or cetrizine (generic for Claritin or zyrtec) 10 mg by mouth daily for allergies.  Avoid D products as this can raise bp -avoid blowing nose excessively or forcefully due to rebound congestion.   7. Obesity BMI 32 --education provided on healthy weight loss through increase in physical activity and proper nutrition    Return in about 6 months (around 11/23/2022) for routine follow up.  Janene Harvey. Biagio Borg [facility] [phone]

## 2022-05-25 LAB — CBC WITH DIFFERENTIAL/PLATELET
Absolute Monocytes: 409 cells/uL (ref 200–950)
Basophils Absolute: 39 cells/uL (ref 0–200)
Basophils Relative: 0.7 %
Eosinophils Absolute: 342 cells/uL (ref 15–500)
Eosinophils Relative: 6.1 %
HCT: 48.1 % (ref 38.5–50.0)
Hemoglobin: 15.9 g/dL (ref 13.2–17.1)
Lymphs Abs: 1456 cells/uL (ref 850–3900)
MCH: 26.9 pg — ABNORMAL LOW (ref 27.0–33.0)
MCHC: 33.1 g/dL (ref 32.0–36.0)
MCV: 81.5 fL (ref 80.0–100.0)
MPV: 9 fL (ref 7.5–12.5)
Monocytes Relative: 7.3 %
Neutro Abs: 3354 cells/uL (ref 1500–7800)
Neutrophils Relative %: 59.9 %
Platelets: 118 10*3/uL — ABNORMAL LOW (ref 140–400)
RBC: 5.9 10*6/uL — ABNORMAL HIGH (ref 4.20–5.80)
RDW: 13.9 % (ref 11.0–15.0)
Total Lymphocyte: 26 %
WBC: 5.6 10*3/uL (ref 3.8–10.8)

## 2022-05-25 LAB — COMPLETE METABOLIC PANEL WITH GFR
AG Ratio: 1.4 (calc) (ref 1.0–2.5)
ALT: 52 U/L — ABNORMAL HIGH (ref 9–46)
AST: 34 U/L (ref 10–35)
Albumin: 4.4 g/dL (ref 3.6–5.1)
Alkaline phosphatase (APISO): 56 U/L (ref 35–144)
BUN: 16 mg/dL (ref 7–25)
CO2: 26 mmol/L (ref 20–32)
Calcium: 9.4 mg/dL (ref 8.6–10.3)
Chloride: 103 mmol/L (ref 98–110)
Creat: 0.91 mg/dL (ref 0.70–1.30)
Globulin: 3.2 g/dL (calc) (ref 1.9–3.7)
Glucose, Bld: 94 mg/dL (ref 65–99)
Potassium: 4.1 mmol/L (ref 3.5–5.3)
Sodium: 137 mmol/L (ref 135–146)
Total Bilirubin: 0.7 mg/dL (ref 0.2–1.2)
Total Protein: 7.6 g/dL (ref 6.1–8.1)
eGFR: 100 mL/min/{1.73_m2} (ref >=60)

## 2022-05-25 LAB — LIPID PANEL
Cholesterol: 92 mg/dL (ref <200)
HDL: 32 mg/dL — ABNORMAL LOW (ref >=40)
LDL Cholesterol (Calc): 38 mg/dL (calc)
Non-HDL Cholesterol (Calc): 60 mg/dL (calc) (ref <130)
Total CHOL/HDL Ratio: 2.9 (calc) (ref <5.0)
Triglycerides: 132 mg/dL (ref <150)

## 2022-05-25 LAB — HEMOGLOBIN A1C
Hgb A1c MFr Bld: 6.1 % of total Hgb — ABNORMAL HIGH (ref <5.7)
Mean Plasma Glucose: 128 mg/dL
eAG (mmol/L): 7.1 mmol/L

## 2022-05-25 LAB — MICROALBUMIN / CREATININE URINE RATIO
Creatinine, Urine: 172 mg/dL (ref 20–320)
Microalb Creat Ratio: 3 mg/g creat (ref <30)
Microalb, Ur: 0.6 mg/dL

## 2022-06-06 DIAGNOSIS — Z125 Encounter for screening for malignant neoplasm of prostate: Secondary | ICD-10-CM | POA: Diagnosis not present

## 2022-06-06 DIAGNOSIS — E291 Testicular hypofunction: Secondary | ICD-10-CM | POA: Diagnosis not present

## 2022-06-12 ENCOUNTER — Encounter: Payer: Self-pay | Admitting: Gastroenterology

## 2022-06-13 DIAGNOSIS — N5201 Erectile dysfunction due to arterial insufficiency: Secondary | ICD-10-CM | POA: Diagnosis not present

## 2022-06-13 DIAGNOSIS — E291 Testicular hypofunction: Secondary | ICD-10-CM | POA: Diagnosis not present

## 2022-07-16 ENCOUNTER — Telehealth: Payer: Self-pay | Admitting: *Deleted

## 2022-07-16 ENCOUNTER — Telehealth (INDEPENDENT_AMBULATORY_CARE_PROVIDER_SITE_OTHER): Payer: BC Managed Care – PPO | Admitting: Nurse Practitioner

## 2022-07-16 ENCOUNTER — Other Ambulatory Visit: Payer: Self-pay | Admitting: Nurse Practitioner

## 2022-07-16 DIAGNOSIS — J040 Acute laryngitis: Secondary | ICD-10-CM | POA: Diagnosis not present

## 2022-07-16 DIAGNOSIS — E1169 Type 2 diabetes mellitus with other specified complication: Secondary | ICD-10-CM

## 2022-07-16 MED ORDER — PREDNISONE 10 MG (21) PO TBPK
ORAL_TABLET | ORAL | 0 refills | Status: DC
Start: 2022-07-16 — End: 2022-07-24

## 2022-07-16 NOTE — Progress Notes (Signed)
  This service is provided via telemedicine  No vital signs collected/recorded due to the encounter was a telemedicine visit.   Location of patient (ex: home, work):  Home  Patient consents to a telephone visit:  Yes  Location of the provider (ex: office, home):  Office [facility]  Name of any referring provider:  na  Names of all persons participating in the telemedicine service and their role in the encounter:  Lesly Dukes, Patient, Nelda Severe, CMA, Abbey Chatters, NP  Time spent on call:  5:49

## 2022-07-16 NOTE — Progress Notes (Signed)
Careteam: Patient Care Team: Sharon Seller, NP as PCP - General (Geriatric Medicine)  Advanced Directive information Does Patient Have a Medical Advance Directive?: No, Would patient like information on creating a medical advance directive?: No - Patient declined  Allergies  Allergen Reactions   Penicillins Rash    Chief Complaint  Patient presents with   Acute Visit    Sore Throat for 4-5 days.      HPI: Patient is a 57 y.o. male due to sore throat.  Started a week ago and then yesterday lost his voice.  No cough or congestion.  No white patches but reports throat is red and painful Pain to the side of his throat.  He is taking advil every night and it has not helped.   No fever or chills.     Review of Systems:  Review of Systems  Constitutional:  Negative for chills, fever, malaise/fatigue and weight loss.  HENT:  Positive for sore throat. Negative for sinus pain and tinnitus.   Respiratory:  Negative for cough, sputum production and shortness of breath.   Cardiovascular:  Negative for chest pain, palpitations and leg swelling.  Skin: Negative.   Neurological:  Negative for headaches.    Past Medical History:  Diagnosis Date   Anxiety    Diabetes (HCC)    High cholesterol    Low testosterone in male    Overweight    Past Surgical History:  Procedure Laterality Date   COLONOSCOPY  2018   IRIDOTOMY / IRIDECTOMY Bilateral 11/11/2020   Dr.Groat   None     Social History:   reports that he quit smoking about 2 years ago. His smoking use included cigarettes. He has a 15.00 pack-year smoking history. He has never used smokeless tobacco. He reports current alcohol use of about 7.0 standard drinks of alcohol per week. He reports that he does not use drugs.  Family History  Problem Relation Age of Onset   Stroke Mother    Diabetes Mother    Stroke Father     Medications: Patient's Medications  New Prescriptions   No medications on file  Previous  Medications   DIAZEPAM (VALIUM) 5 MG TABLET    TAKE 1 TABLET BY MOUTH 2 TIMES DAILY AS NEEDED.   DIPHENHYDRAMINE HCL, SLEEP, (SLEEP AID) 50 MG CAPS    Take 1 capsule by mouth at bedtime.   DULAGLUTIDE (TRULICITY) 0.75 MG/0.5ML SOPN    Inject 0.75 mg into the skin once a week.   IBUPROFEN (ADVIL) 200 MG TABLET    Take 200 mg by mouth as needed.   ROSUVASTATIN (CRESTOR) 40 MG TABLET    Take 1 tablet (40 mg total) by mouth daily.   SILDENAFIL (VIAGRA) 100 MG TABLET    TAKE 1 TABLET BY MOUTH AS NEEDED. FOR ERECTILE DYSFUNCTION.   TESTOSTERONE CYPIONATE (DEPOTESTOTERONE CYPIONATE) 100 MG/ML INJECTION    Inject 100 mg into the muscle once a week.  Modified Medications   No medications on file  Discontinued Medications   No medications on file    Physical Exam:  There were no vitals filed for this visit. There is no height or weight on file to calculate BMI. Wt Readings from Last 3 Encounters:  05/24/22 217 lb (98.4 kg)  11/23/21 213 lb (96.6 kg)  04/13/21 200 lb 12.8 oz (91.1 kg)    Physical Exam Constitutional:      Appearance: Normal appearance.  Neurological:     Mental Status: He is  alert. Mental status is at baseline.  Psychiatric:        Mood and Affect: Mood normal.     Labs reviewed: Basic Metabolic Panel: Recent Labs    11/23/21 1407 05/24/22 1547  NA 138 137  K 4.1 4.1  CL 105 103  CO2 26 26  GLUCOSE 96 94  BUN 16 16  CREATININE 0.84 0.91  CALCIUM 8.9 9.4   Liver Function Tests: Recent Labs    11/23/21 1407 05/24/22 1547  AST 33 34  ALT 44 52*  BILITOT 0.8 0.7  PROT 7.7 7.6   No results for input(s): "LIPASE", "AMYLASE" in the last 8760 hours. No results for input(s): "AMMONIA" in the last 8760 hours. CBC: Recent Labs    11/23/21 1407 05/24/22 1547  WBC 4.5 5.6  NEUTROABS 2,516 3,354  HGB 15.8 15.9  HCT 47.1 48.1  MCV 83.8 81.5  PLT 113* 118*   Lipid Panel: Recent Labs    11/23/21 1407 05/24/22 1547  CHOL 99 92  HDL 32* 32*  LDLCALC  47 38  TRIG 122 132  CHOLHDL 3.1 2.9   TSH: No results for input(s): "TSH" in the last 8760 hours. A1C: Lab Results  Component Value Date   HGBA1C 6.1 (H) 05/24/2022     Assessment/Plan 1. Laryngitis -rest voice as much as possible -plenty of water.  - predniSONE (STERAPRED UNI-PAK 21 TAB) 10 MG (21) TBPK tablet; Use as directed  Dispense: 21 tablet; Refill: 0  -gargle with salt water PRN -to notify if symptoms do not improve or worsen with treatment -avoid alcohol  No NSAIDs while taking prednisone.    Janene Harvey. Biagio Borg  [facility] [phone]    Virtual Visit via mychart video  I connected with patient on 07/16/22 at  9:20 AM EDT by video and verified that I am speaking with the correct person using two identifiers.  Location: Patient: home Provider: [facility]   I discussed the limitations, risks, security and privacy concerns of performing an evaluation and management service by telephone and the availability of in person appointments. I also discussed with the patient that there may be a patient responsible charge related to this service. The patient expressed understanding and agreed to proceed.   I discussed the assessment and treatment plan with the patient. The patient was provided an opportunity to ask questions and all were answered. The patient agreed with the plan and demonstrated an understanding of the instructions.   The patient was advised to call back or seek an in-person evaluation if the symptoms worsen or if the condition fails to improve as anticipated.  I provided 15 minutes of non-face-to-face time during this encounter.  Janene Harvey. Biagio Borg Avs printed and mailed

## 2022-07-16 NOTE — Telephone Encounter (Signed)
[name], bonfante are scheduled for a virtual visit with your provider today.    Just as we do with appointments in the office, we must obtain your consent to participate.  Your consent will be active for this visit and any virtual visit you James Woods have with one of our providers in the next 365 days.    If you have a MyChart account, I can also send a copy of this consent to you electronically.  All virtual visits are billed to your insurance company just like a traditional visit in the office.  As this is a virtual visit, video technology does not allow for your provider to perform a traditional examination.  This James Woods limit your provider's ability to fully assess your condition.  If your provider identifies any concerns that need to be evaluated in person or the need to arrange testing such as labs, EKG, etc, we will make arrangements to do so.    Although advances in technology are sophisticated, we cannot ensure that it will always work on either your end or our end.  If the connection with a video visit is poor, we James Woods have to switch to a telephone visit.  With either a video or telephone visit, we are not always able to ensure that we have a secure connection.   I need to obtain your verbal consent now.   Are you willing to proceed with your visit today?   James Woods has provided verbal consent on 07/16/2022 for a virtual visit (video or telephone).   James Woods, [place] 07/16/2022  9:28 AM

## 2022-07-24 ENCOUNTER — Encounter: Payer: Self-pay | Admitting: Adult Health

## 2022-07-24 ENCOUNTER — Ambulatory Visit: Payer: BC Managed Care – PPO | Admitting: Adult Health

## 2022-07-24 VITALS — BP 122/80 | HR 100 | Temp 97.5°F | Ht 68.0 in | Wt 224.0 lb

## 2022-07-24 DIAGNOSIS — J029 Acute pharyngitis, unspecified: Secondary | ICD-10-CM

## 2022-07-24 DIAGNOSIS — E1169 Type 2 diabetes mellitus with other specified complication: Secondary | ICD-10-CM

## 2022-07-24 LAB — POC COVID19 BINAXNOW: SARS Coronavirus 2 Ag: NEGATIVE

## 2022-07-24 LAB — POCT RAPID STREP A (OFFICE): Rapid Strep A Screen: NEGATIVE

## 2022-07-24 MED ORDER — AZITHROMYCIN 250 MG PO TABS
ORAL_TABLET | ORAL | 0 refills | Status: AC
Start: 2022-07-24 — End: 2022-07-29

## 2022-07-24 NOTE — Progress Notes (Signed)
[facility] clinic  Provider:  Kenard Gower DNP  Code Status:  Full Code  Goals of Care:     07/24/2022    8:11 AM  Advanced Directives  Does Patient Have a Medical Advance Directive? No  Would patient like information on creating a medical advance directive? No - Patient declined     Chief Complaint  Patient presents with   Acute Visit    Congestion and sore throat x 2 weeks. No at home covid test performed and symptoms ongoing since last virtual visit despite taking prednisone.      HPI: Patient is a 57 y.o. male seen today for an acute visit for sore throat X 2 weeks. He has "lost his voice" 2 weeks ago. He has a hoarse voice. 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 He was started on Prednisone but after 2 days became light headed and stopped. He is diabetic and takes Trulicity. Latest A1C 6.1, 05/24/22. He had a similar episode of sore throat before and was given Z-Pak which resolved it. He denies chills nor fever.   Tested negative for COVID-19 and rapid strep A.  Past Medical History:  Diagnosis Date   Anxiety    Diabetes (HCC)    High cholesterol    Low testosterone in male    Overweight     Past Surgical History:  Procedure Laterality Date   COLONOSCOPY  2018   IRIDOTOMY / IRIDECTOMY Bilateral 11/11/2020   Dr.Groat   None      Allergies  Allergen Reactions   Penicillins Rash    Outpatient Encounter Medications as of 07/24/2022  Medication Sig   diazepam (VALIUM) 5 MG tablet TAKE 1 TABLET BY MOUTH 2 TIMES DAILY AS NEEDED.   diphenhydrAMINE HCl, Sleep, (SLEEP AID) 50 MG CAPS Take 1 capsule by mouth at bedtime.   Dulaglutide (TRULICITY) 0.75 MG/0.5ML SOPN INJECT 0.75 MG SUBCUTANEOUSLY ONE TIME PER WEEK   ibuprofen (ADVIL) 200 MG tablet Take 200 mg by mouth as needed.   rosuvastatin (CRESTOR) 40 MG tablet Take 1 tablet (40 mg total) by mouth daily.   sildenafil (VIAGRA) 100 MG tablet TAKE 1 TABLET BY  MOUTH AS NEEDED. FOR ERECTILE DYSFUNCTION.   testosterone cypionate (DEPOTESTOTERONE CYPIONATE) 100 MG/ML injection Inject 100 mg into the muscle once a week.   [DISCONTINUED] predniSONE (STERAPRED UNI-PAK 21 TAB) 10 MG (21) TBPK tablet Use as directed (Patient not taking: Reported on 07/24/2022)   No facility-administered encounter medications on file as of 07/24/2022.    Review of Systems:  Review of Systems  Constitutional:  Negative for activity change, appetite change and fever.  HENT:  Positive for sore throat and voice change.   Eyes: Negative.   Cardiovascular:  Negative for chest pain and leg swelling.  Gastrointestinal:  Negative for abdominal distention, diarrhea and vomiting.  Genitourinary:  Negative for dysuria, frequency and urgency.  Skin:  Negative for color change.  Neurological:  Negative for dizziness and headaches.  Psychiatric/Behavioral:  Negative for behavioral problems and sleep disturbance. The patient is not nervous/anxious.     Health Maintenance  Topic Date Due   DTaP/Tdap/Td (1 - Tdap) Never done   Zoster Vaccines- Shingrix (1 of 2) Never done   Colonoscopy  07/13/2022   OPHTHALMOLOGY EXAM  07/13/2022   COVID-19 Vaccine (4 - 2023-24 season) 02/12/2028 (Originally 10/12/2021)   INFLUENZA VACCINE  09/12/2022   HEMOGLOBIN A1C  11/23/2022  FOOT EXAM  11/24/2022   Diabetic kidney evaluation - eGFR measurement  05/24/2023   Diabetic kidney evaluation - Urine ACR  05/24/2023   Hepatitis C Screening  Completed   HIV Screening  Completed   HPV VACCINES  Aged Out    Physical Exam: Vitals:   07/24/22 1252  BP: 122/80  Pulse: 100  Temp: (!) 97.5 F (36.4 C)  TempSrc: Temporal  SpO2: 96%  Weight: 224 lb (101.6 kg)  Height: 5\' 8"  (1.727 m)   Body mass index is 34.06 kg/m. Physical Exam Constitutional:      Appearance: Normal appearance.  HENT:     Head: Normocephalic and atraumatic.     Mouth/Throat:     Mouth: Mucous membranes are moist.      Comments: Bilateral pharynx erythematous Eyes:     Conjunctiva/sclera: Conjunctivae normal.  Cardiovascular:     Rate and Rhythm: Normal rate and regular rhythm.     Pulses: Normal pulses.     Heart sounds: Normal heart sounds.  Pulmonary:     Effort: Pulmonary effort is normal.     Breath sounds: Normal breath sounds.  Abdominal:     General: Bowel sounds are normal.     Palpations: Abdomen is soft.  Musculoskeletal:        General: No swelling. Normal range of motion.     Cervical back: Normal range of motion.  Skin:    General: Skin is warm and dry.  Neurological:     General: No focal deficit present.     Mental Status: He is alert and oriented to person, place, and time.  Psychiatric:        Mood and Affect: Mood normal.        Behavior: Behavior normal.        Thought Content: Thought content normal.        Judgment: Judgment normal.     Labs reviewed: Basic Metabolic Panel: Recent Labs    11/23/21 1407 05/24/22 1547  NA 138 137  K 4.1 4.1  CL 105 103  CO2 26 26  GLUCOSE 96 94  BUN 16 16  CREATININE 0.84 0.91  CALCIUM 8.9 9.4   Liver Function Tests: Recent Labs    11/23/21 1407 05/24/22 1547  AST 33 34  ALT 44 52*  BILITOT 0.8 0.7  PROT 7.7 7.6   No results for input(s): "LIPASE", "AMYLASE" in the last 8760 hours. No results for input(s): "AMMONIA" in the last 8760 hours. CBC: Recent Labs    11/23/21 1407 05/24/22 1547  WBC 4.5 5.6  NEUTROABS 2,516 3,354  HGB 15.8 15.9  HCT 47.1 48.1  MCV 83.8 81.5  PLT 113* 118*   Lipid Panel: Recent Labs    11/23/21 1407 05/24/22 1547  CHOL 99 92  HDL 32* 32*  LDLCALC 47 38  TRIG 122 132  CHOLHDL 3.1 2.9   Lab Results  Component Value Date   HGBA1C 6.1 (H) 05/24/2022    Procedures since last visit: No results found.  Assessment/Plan  1. Acute pharyngitis, unspecified etiology -  instructed to gargle with warm water and salt TID - POC COVID-19 negative - POCT rapid strep A negative -  azithromycin (ZITHROMAX) 250 MG tablet; Take 2 tablets on day 1, then 1 tablet daily on days 2 through 5  Dispense: 6 tablet; Refill: 0  2. Type 2 diabetes mellitus with other specified complication, without long-term current use of insulin [facility]) Lab Results  Component Value Date  HGBA1C 6.1 (H) 05/24/2022   -  continue Trulicity   Labs/tests ordered:  - POC COVID-19 - POCT rapid strep A   Next appt:  11/22/2022

## 2022-08-08 ENCOUNTER — Other Ambulatory Visit: Payer: Self-pay | Admitting: Adult Health

## 2022-08-08 DIAGNOSIS — F411 Generalized anxiety disorder: Secondary | ICD-10-CM

## 2022-08-08 NOTE — Telephone Encounter (Signed)
Pt is scheduled for 7/2.

## 2022-08-08 NOTE — Telephone Encounter (Signed)
Please call patient to schedule an appt, was a no show last one.

## 2022-08-13 ENCOUNTER — Encounter: Payer: Self-pay | Admitting: Adult Health

## 2022-08-13 ENCOUNTER — Telehealth (INDEPENDENT_AMBULATORY_CARE_PROVIDER_SITE_OTHER): Payer: BC Managed Care – PPO | Admitting: Adult Health

## 2022-08-13 DIAGNOSIS — F411 Generalized anxiety disorder: Secondary | ICD-10-CM | POA: Diagnosis not present

## 2022-08-13 DIAGNOSIS — F331 Major depressive disorder, recurrent, moderate: Secondary | ICD-10-CM | POA: Diagnosis not present

## 2022-08-13 MED ORDER — DIAZEPAM 5 MG PO TABS: 5.0000 mg | ORAL_TABLET | Freq: Two times a day (BID) | ORAL | 2 refills | Status: DC | PRN

## 2022-08-13 NOTE — Progress Notes (Signed)
James Woods [id number] 26-Jun-1965 57 y.o.  Virtual Visit via Video Note  I connected with pt @ on 08/13/22 at  1:20 PM EDT by a video enabled telemedicine application and verified that I am speaking with the correct person using two identifiers.   I discussed the limitations of evaluation and management by telemedicine and the availability of in person appointments. The patient expressed understanding and agreed to proceed.  I discussed the assessment and treatment plan with the patient. The patient was provided an opportunity to ask questions and all were answered. The patient agreed with the plan and demonstrated an understanding of the instructions.   The patient was advised to call back or seek an in-person evaluation if the symptoms worsen or if the condition fails to improve as anticipated.  I provided 10 minutes of non-face-to-face time during this encounter.  The patient was located at home.  The provider was located at [facility].   Dorothyann Gibbs, NP   Subjective:   Patient ID:  James Woods is a 57 y.o. (DOB [birth year]) male.  Chief Complaint: No chief complaint on file.   HPI Tayvin Silos presents for follow-up of MDD and GAD.   Describes mood today as "ok". Pleasant. Denies tearfulness. Mood symptoms - reports some depression and anxiety. Denies irritability. Denies panic attacks. Mood is consistent. Stating "I fell like I'm doing ok". Feels like the Valium continues to work well for him. Stable interest and motivation. Taking medications as prescribed. Energy levels "ok". Active, does not have a regular exercise routine.   Enjoys some usual interests and activities. Divorced. Has 2 grown children - daughters 51 and 72. Spending time with family. Appetite adequate. Weight gain - elevated A1C. Taking Trulicity. Sleeps well most nights. Averages 7 hours. Focus and concentration stable. Completing tasks. Managing aspects of household. Works full-time -  [employer]. Denies SI or HI.  Denies AH or VH. Denies substance use. Denies self harm.  Previous medications: Lamictal   Review of Systems:  Review of Systems  Musculoskeletal:  Negative for gait problem.  Neurological:  Negative for tremors.  Psychiatric/Behavioral:         Please refer to HPI    Medications: I have reviewed the patient's current medications.  Current Outpatient Medications  Medication Sig Dispense Refill   diazepam (VALIUM) 5 MG tablet Take 1 tablet (5 mg total) by mouth 2 (two) times daily as needed. 60 tablet 2   diphenhydrAMINE HCl, Sleep, (SLEEP AID) 50 MG CAPS Take 1 capsule by mouth at bedtime.     Dulaglutide (TRULICITY) 0.75 MG/0.5ML SOPN INJECT 0.75 MG SUBCUTANEOUSLY ONE TIME PER WEEK 6 mL 3   ibuprofen (ADVIL) 200 MG tablet Take 200 mg by mouth as needed.     rosuvastatin (CRESTOR) 40 MG tablet Take 1 tablet (40 mg total) by mouth daily. 90 tablet 1   sildenafil (VIAGRA) 100 MG tablet TAKE 1 TABLET BY MOUTH AS NEEDED. FOR ERECTILE DYSFUNCTION. 6 tablet 11   testosterone cypionate (DEPOTESTOTERONE CYPIONATE) 100 MG/ML injection Inject 100 mg into the muscle once a week.     No current facility-administered medications for this visit.    Medication Side Effects: None  Allergies:  Allergies  Allergen Reactions   Penicillins Rash    Past Medical History:  Diagnosis Date   Anxiety    Diabetes (HCC)    High cholesterol    Low testosterone in male    Overweight     Family History  Problem  Relation Age of Onset   Stroke Mother    Diabetes Mother    Stroke Father     Social History   Socioeconomic History   Marital status: Married    Spouse name: Not on file   Number of children: Not on file   Years of education: Not on file   Highest education level: Not on file  Occupational History   Not on file  Tobacco Use   Smoking status: Former    Packs/day: 0.50    Years: 30.00    Additional pack years: 0.00    Total pack  years: 15.00    Types: Cigarettes    Quit date: 02/12/2020    Years since quitting: 2.5   Smokeless tobacco: Never  Vaping Use   Vaping Use: Some days   Start date: 07/12/2020  Substance and Sexual Activity   Alcohol use: Yes    Alcohol/week: 7.0 standard drinks of alcohol    Types: 7 Cans of beer per week   Drug use: Never   Sexual activity: Not on file  Other Topics Concern   Not on file  Social History Narrative   Tobacco use:  1/2 pack per day, 30 years   Alcohol use: None   Diet:     Do you drink/eat things with caffeine?  No.   Marital status: Married in 1993.    Do you live in a house, apartment, assisted living, condo, trailer, etc.)? House   Is it one or more stories? One story.   How many persons live in your home?  2   Do you have any pets in your home? (please list)  3 dogs   Highest level of education: - Some college.   Current or past profession:  Retail   Do you exercise:  No.       Advanced Directive   No Living Will, no DNR, no POA/HPOA      FUNCTIONAL STATUS   No difficulty with bathing, dressing, preparing food, eating, managing medications, finances. No difficulty with affording medications.    Social Determinants of Health   Financial Resource Strain: Not on file  Food Insecurity: Not on file  Transportation Needs: Not on file  Physical Activity: Not on file  Stress: Not on file  Social Connections: Not on file  Intimate Partner Violence: Not on file    Past Medical History, Surgical history, Social history, and Family history were reviewed and updated as appropriate.   Please see review of systems for further details on the patient's review from today.   Objective:   Physical Exam:  There were no vitals taken for this visit.  Physical Exam Constitutional:      General: He is not in acute distress. Musculoskeletal:        General: No deformity.  Neurological:     Mental Status: He is alert and oriented to person, place, and time.      Coordination: Coordination normal.  Psychiatric:        Attention and Perception: Attention and perception normal. He does not perceive auditory or visual hallucinations.        Mood and Affect: Affect is not labile, blunt, angry or inappropriate.        Speech: Speech normal.        Behavior: Behavior normal.        Thought Content: Thought content normal. Thought content is not paranoid or delusional. Thought content does not include homicidal or suicidal ideation. Thought content  does not include homicidal or suicidal plan.        Cognition and Memory: Cognition and memory normal.        Judgment: Judgment normal.     Comments: Insight intact     Lab Review:     Component Value Date/Time   NA 137 05/24/2022 1547   K 4.1 05/24/2022 1547   CL 103 05/24/2022 1547   CO2 26 05/24/2022 1547   GLUCOSE 94 05/24/2022 1547   BUN 16 05/24/2022 1547   CREATININE 0.91 05/24/2022 1547   CALCIUM 9.4 05/24/2022 1547   PROT 7.6 05/24/2022 1547   AST 34 05/24/2022 1547   ALT 52 (H) 05/24/2022 1547   BILITOT 0.7 05/24/2022 1547   GFRNONAA 84 06/01/2020 0813   GFRAA 97 06/01/2020 0813       Component Value Date/Time   WBC 5.6 05/24/2022 1547   RBC 5.90 (H) 05/24/2022 1547   HGB 15.9 05/24/2022 1547   HCT 48.1 05/24/2022 1547   PLT 118 (L) 05/24/2022 1547   MCV 81.5 05/24/2022 1547   MCH 26.9 (L) 05/24/2022 1547   MCHC 33.1 05/24/2022 1547   RDW 13.9 05/24/2022 1547   LYMPHSABS 1,456 05/24/2022 1547   EOSABS 342 05/24/2022 1547   BASOSABS 39 05/24/2022 1547    No results found for: "POCLITH", "LITHIUM"   No results found for: "PHENYTOIN", "PHENOBARB", "VALPROATE", "CBMZ"   .res Assessment: Plan:    Plan:  PDMP reviewed  Valium 5mg  twice daily - take 1/2 tablet up to 4 times.  RTC 6 months  Patient advised to contact office with any questions, adverse effects, or acute worsening in signs and symptoms.  Discussed potential benefits, risk, and side effects of  benzodiazepines to include potential risk of tolerance and dependence, as well as possible drowsiness.  Advised patient not to drive if experiencing drowsiness and to take lowest possible effective dose to minimize risk of dependence and tolerance.  Diagnoses and all orders for this visit:  Major depressive disorder, recurrent episode, moderate (HCC)  Generalized anxiety disorder -     diazepam (VALIUM) 5 MG tablet; Take 1 tablet (5 mg total) by mouth 2 (two) times daily as needed.     Please see After Visit Summary for patient specific instructions.  Future Appointments  Date Time Provider Department Center  08/29/2022  2:30 PM Jenel Lucks, MD [facility]  11/22/2022  3:20 PM Sharon Seller, NP [facility] None    No orders of the defined types were placed in this encounter.     -------------------------------

## 2022-08-29 ENCOUNTER — Ambulatory Visit: Payer: BC Managed Care – PPO | Admitting: Gastroenterology

## 2022-08-29 ENCOUNTER — Encounter: Payer: Self-pay | Admitting: Gastroenterology

## 2022-08-29 ENCOUNTER — Other Ambulatory Visit (INDEPENDENT_AMBULATORY_CARE_PROVIDER_SITE_OTHER): Payer: BC Managed Care – PPO

## 2022-08-29 VITALS — BP 110/80 | HR 93 | Ht 68.0 in | Wt 224.0 lb

## 2022-08-29 DIAGNOSIS — Z86010 Personal history of colonic polyps: Secondary | ICD-10-CM

## 2022-08-29 DIAGNOSIS — D696 Thrombocytopenia, unspecified: Secondary | ICD-10-CM

## 2022-08-29 LAB — CBC WITH DIFFERENTIAL/PLATELET
Basophils Absolute: 0 10*3/uL (ref 0.0–0.1)
Basophils Relative: 0.9 % (ref 0.0–3.0)
Eosinophils Absolute: 0.2 10*3/uL (ref 0.0–0.7)
Eosinophils Relative: 4.5 % (ref 0.0–5.0)
HCT: 50.3 % (ref 39.0–52.0)
Hemoglobin: 16.6 g/dL (ref 13.0–17.0)
Lymphocytes Relative: 24.1 % (ref 12.0–46.0)
Lymphs Abs: 1.3 10*3/uL (ref 0.7–4.0)
MCHC: 33.1 g/dL (ref 30.0–36.0)
MCV: 81.9 fl (ref 78.0–100.0)
Monocytes Absolute: 0.5 10*3/uL (ref 0.1–1.0)
Monocytes Relative: 8.2 % (ref 3.0–12.0)
Neutro Abs: 3.5 10*3/uL (ref 1.4–7.7)
Neutrophils Relative %: 62.3 % (ref 43.0–77.0)
Platelets: 139 10*3/uL — ABNORMAL LOW (ref 150.0–400.0)
RBC: 6.14 Mil/uL — ABNORMAL HIGH (ref 4.22–5.81)
RDW: 14 % (ref 11.5–15.5)
WBC: 5.5 10*3/uL (ref 4.0–10.5)

## 2022-08-29 MED ORDER — NA SULFATE-K SULFATE-MG SULF 17.5-3.13-1.6 GM/177ML PO SOLN: 1.0000 | Freq: Once | ORAL | 0 refills | Status: AC

## 2022-08-29 NOTE — Progress Notes (Signed)
HPI : James Woods is a 57 y.o. male who is referred to [place] by Sharon Seller, NP for colon cancer screening.  The patient states that he had 1 previous colonoscopy, but does not recall exactly when or where it was performed.  He knows it was before the age of 45 and recalls having 4 polyps removed.  Believes he was recommended to repeat in 3-5 years. He has no family history of colon cancer. He has irregular bowel habits, sometimes problems with constipation and excessive gas, but denies symptoms of bloody stools.  No weight loss, rather his weight has increased by about 20 pounds over the last year.  He used to drink beer daily, but has cut back in the past few months, and now only drinks occasionally.  He has no known cardiopulmonary comorbidities and denies any concerning symptoms such as chest pain/pressure, shortness of breath, lightheadedness/dizziness, palpitations or orthopnea.   Two recent CBCs from April and October showed a low platelet count.    Past Medical History:  Diagnosis Date   Anxiety    Diabetes (HCC)    High cholesterol    Low testosterone in male    Overweight      Past Surgical History:  Procedure Laterality Date   COLONOSCOPY  2018   IRIDOTOMY / IRIDECTOMY Bilateral 11/11/2020   Dr.Groat   None     Family History  Problem Relation Age of Onset   Stroke Mother    Diabetes Mother    Stroke Father    Esophageal cancer Neg Hx    Stomach cancer Neg Hx    Colon cancer Neg Hx    Social History   Tobacco Use   Smoking status: Former    Current packs/day: 0.00    Average packs/day: 0.5 packs/day for 30.0 years (15.0 ttl pk-yrs)    Types: Cigarettes    Start date: 02/11/1990    Quit date: 02/12/2020    Years since quitting: 2.5   Smokeless tobacco: Never  Vaping Use   Vaping status: Some Days   Start date: 07/12/2020   Substances: Nicotine, Flavoring  Substance Use Topics   Alcohol use: Yes    Alcohol/week: 7.0 standard drinks of alcohol     Types: 7 Cans of beer per week    Comment: occassionally   Drug use: Never   Current Outpatient Medications  Medication Sig Dispense Refill   diazepam (VALIUM) 5 MG tablet Take 1 tablet (5 mg total) by mouth 2 (two) times daily as needed. 60 tablet 2   diphenhydrAMINE HCl, Sleep, (SLEEP AID) 50 MG CAPS Take 1 capsule by mouth at bedtime.     Dulaglutide (TRULICITY) 0.75 MG/0.5ML SOPN INJECT 0.75 MG SUBCUTANEOUSLY ONE TIME PER WEEK 6 mL 3   ibuprofen (ADVIL) 200 MG tablet Take 200 mg by mouth as needed.     rosuvastatin (CRESTOR) 40 MG tablet Take 1 tablet (40 mg total) by mouth daily. 90 tablet 1   sildenafil (VIAGRA) 100 MG tablet TAKE 1 TABLET BY MOUTH AS NEEDED. FOR ERECTILE DYSFUNCTION. 6 tablet 11   testosterone cypionate (DEPOTESTOTERONE CYPIONATE) 100 MG/ML injection Inject 100 mg into the muscle once a week.     No current facility-administered medications for this visit.   Allergies  Allergen Reactions   Penicillins Rash     Review of Systems: All systems reviewed and negative except where noted in HPI.    No results found.  Physical Exam: BP 110/80   Pulse 93  Ht 5\' 8"  (1.727 m)   Wt 224 lb (101.6 kg)   BMI 34.06 kg/m  Constitutional: Pleasant,well-developed, [place] male in no acute distress. HEENT: Normocephalic and atraumatic. Conjunctivae are normal. No scleral icterus. Neck supple.  Cardiovascular: Normal rate, regular rhythm.  Pulmonary/chest: Effort normal and breath sounds normal. No wheezing, rales or rhonchi. Abdominal: Soft, nondistended, nontender. Bowel sounds active throughout. There are no masses palpable. No hepatomegaly. Extremities: no edema Neurological: Alert and oriented to person place and time. Skin: Skin is warm and dry. No rashes noted. Psychiatric: Normal mood and affect. Behavior is normal.  CBC    Component Value Date/Time   WBC 5.6 05/24/2022 1547   RBC 5.90 (H) 05/24/2022 1547   HGB 15.9 05/24/2022 1547   HCT 48.1  05/24/2022 1547   PLT 118 (L) 05/24/2022 1547   MCV 81.5 05/24/2022 1547   MCH 26.9 (L) 05/24/2022 1547   MCHC 33.1 05/24/2022 1547   RDW 13.9 05/24/2022 1547   LYMPHSABS 1,456 05/24/2022 1547   EOSABS 342 05/24/2022 1547   BASOSABS 39 05/24/2022 1547    CMP     Component Value Date/Time   NA 137 05/24/2022 1547   K 4.1 05/24/2022 1547   CL 103 05/24/2022 1547   CO2 26 05/24/2022 1547   GLUCOSE 94 05/24/2022 1547   BUN 16 05/24/2022 1547   CREATININE 0.91 05/24/2022 1547   CALCIUM 9.4 05/24/2022 1547   PROT 7.6 05/24/2022 1547   AST 34 05/24/2022 1547   ALT 52 (H) 05/24/2022 1547   BILITOT 0.7 05/24/2022 1547   GFRNONAA 84 06/01/2020 0813   GFRAA 97 06/01/2020 0813       Latest Ref Rng & Units 05/24/2022    3:47 PM 11/23/2021    2:07 PM 04/13/2021    3:41 PM  CBC EXTENDED  WBC 3.8 - 10.8 Thousand/uL 5.6  4.5  6.5   RBC 4.20 - 5.80 Million/uL 5.90  5.62  5.95   Hemoglobin 13.2 - 17.1 g/dL 82.9  56.2  13.0   HCT 38.5 - 50.0 % 48.1  47.1  49.7   Platelets 140 - 400 Thousand/uL 118  113  157   NEUT# 1,500 - 7,800 cells/uL 3,354  2,516  3,634   Lymph# 850 - 3,900 cells/uL 1,456  1,377  2,119       ASSESSMENT AND PLAN: 57 year old male with history of diabetes and reported history of colon polyps (details unavailable), likely due for surveillance colonoscopy.  He reports having 4 polyps removed sometime in his late 46s.  He has no idea where the colonoscopy was performed or how to get the procedure report.  Based on his report, I do agree it is likely time to do a colonoscopy even though I do not have the details of his previous colonoscopy. Will schedule patient for routine surveillance colonoscopy. Will repeat his CBC to see if his thrombocytopenia is persistent.  If so, we will obtain abdominal ultrasound to look for any evidence of cirrhosis/splenomegaly as a possible cause of his thrombocytopenia  History of polyps - Colonoscopy  Thrombocytopenia - Repeat  CBC  The details, risks (including bleeding, perforation, infection, missed lesions, medication reactions and possible hospitalization or surgery if complications occur), benefits, and alternatives to colonoscopy with possible biopsy and possible polypectomy were discussed with the patient and he consents to proceed.   Tiffiany Beadles E. Tomasa Rand, MD  Gastroenterology   Janyth Contes Janene Harvey, NP

## 2022-08-29 NOTE — Patient Instructions (Signed)
You have been scheduled for a colonoscopy. Please follow written instructions given to you at your visit today.   Please pick up your prep supplies at the pharmacy within the next 1-3 days.  If you use inhalers (even only as needed), please bring them with you on the day of your procedure.  DO NOT TAKE 7 DAYS PRIOR TO TEST- Trulicity (dulaglutide) Ozempic, Wegovy (semaglutide) Mounjaro (tirzepatide) Bydureon Bcise (exanatide extended release)  DO NOT TAKE 1 DAY PRIOR TO YOUR TEST Rybelsus (semaglutide) Adlyxin (lixisenatide) Victoza (liraglutide) Byetta (exanatide) ___________________________________________________________________________  _______________________________________________________  If your blood pressure at your visit was 140/90 or greater, please contact your primary care physician to follow up on this.  _______________________________________________________  If you are age 39 or older, your body mass index should be between 23-30. Your Body mass index is 34.06 kg/m. If this is out of the aforementioned range listed, please consider follow up with your Primary Care Provider.  If you are age 43 or younger, your body mass index should be between 19-25. Your Body mass index is 34.06 kg/m. If this is out of the aformentioned range listed, please consider follow up with your Primary Care Provider.   ________________________________________________________  The [facility] providers would like to encourage you to use [facility] to communicate with providers for non-urgent requests or questions.  Due to long hold times on the telephone, sending your provider a message by [facility] may be a faster and more efficient way to get a response.  Please allow 48 business hours for a response.  Please remember that this is for non-urgent requests.  _______________________________________________________

## 2022-09-02 ENCOUNTER — Other Ambulatory Visit: Payer: Self-pay

## 2022-09-02 DIAGNOSIS — D696 Thrombocytopenia, unspecified: Secondary | ICD-10-CM

## 2022-09-02 NOTE — Progress Notes (Signed)
Mr. James Woods,  Your platelet count is still low.  I would like to get an ultrasound of the abdomen to look for liver disease and an enlarged spleen as a possible cause of your low platelets.  Bonita Quin,  Can you please place an order for a complete abdominal ultrasound to r/o cirrhosis and splenomegaly?

## 2022-09-04 ENCOUNTER — Other Ambulatory Visit: Payer: Self-pay | Admitting: *Deleted

## 2022-09-04 DIAGNOSIS — R161 Splenomegaly, not elsewhere classified: Secondary | ICD-10-CM

## 2022-09-12 ENCOUNTER — Ambulatory Visit (HOSPITAL_COMMUNITY)
Admission: RE | Admit: 2022-09-12 | Discharge: 2022-09-12 | Disposition: A | Discharge status: Home / Self Care | Payer: BC Managed Care – PPO | Source: Ambulatory Visit | Attending: Gastroenterology | Admitting: Gastroenterology

## 2022-09-12 DIAGNOSIS — D696 Thrombocytopenia, unspecified: Secondary | ICD-10-CM | POA: Diagnosis not present

## 2022-09-12 DIAGNOSIS — R161 Splenomegaly, not elsewhere classified: Secondary | ICD-10-CM | POA: Diagnosis not present

## 2022-09-12 DIAGNOSIS — K820 Obstruction of gallbladder: Secondary | ICD-10-CM | POA: Diagnosis not present

## 2022-09-12 DIAGNOSIS — R1900 Intra-abdominal and pelvic swelling, mass and lump, unspecified site: Secondary | ICD-10-CM | POA: Diagnosis not present

## 2022-09-17 ENCOUNTER — Encounter: Payer: Self-pay | Admitting: Nurse Practitioner

## 2022-09-17 ENCOUNTER — Telehealth (INDEPENDENT_AMBULATORY_CARE_PROVIDER_SITE_OTHER): Payer: BC Managed Care – PPO | Admitting: Nurse Practitioner

## 2022-09-17 ENCOUNTER — Telehealth: Payer: Self-pay | Admitting: *Deleted

## 2022-09-17 DIAGNOSIS — Z6834 Body mass index (BMI) 34.0-34.9, adult: Secondary | ICD-10-CM

## 2022-09-17 DIAGNOSIS — E6609 Other obesity due to excess calories: Secondary | ICD-10-CM

## 2022-09-17 DIAGNOSIS — E1169 Type 2 diabetes mellitus with other specified complication: Secondary | ICD-10-CM

## 2022-09-17 MED ORDER — TIRZEPATIDE 2.5 MG/0.5ML ~~LOC~~ SOAJ: 2.5000 mg | SUBCUTANEOUS | 1 refills | Status: DC

## 2022-09-17 NOTE — Telephone Encounter (Signed)
Mr. aymen, preszler are scheduled for a virtual visit with your provider today.    Just as we do with appointments in the office, we must obtain your consent to participate.  Your consent will be active for this visit and any virtual visit you Laren Whaling have with one of our providers in the next 365 days.    If you have a MyChart account, I can also send a copy of this consent to you electronically.  All virtual visits are billed to your insurance company just like a traditional visit in the office.  As this is a virtual visit, video technology does not allow for your provider to perform a traditional examination.  This Jailyn Leeson limit your provider's ability to fully assess your condition.  If your provider identifies any concerns that need to be evaluated in person or the need to arrange testing such as labs, EKG, etc, we will make arrangements to do so.    Although advances in technology are sophisticated, we cannot ensure that it will always work on either your end or our end.  If the connection with a video visit is poor, we Ilijah Doucet have to switch to a telephone visit.  With either a video or telephone visit, we are not always able to ensure that we have a secure connection.   I need to obtain your verbal consent now.   Are you willing to proceed with your visit today?   Bernie Pflanz has provided verbal consent on 09/17/2022 for a virtual visit (video or telephone).   MayBeckey Downing, [place] 09/17/2022  11:03 AM

## 2022-09-17 NOTE — Progress Notes (Addendum)
Careteam: Patient Care Team: Sharon Seller, NP as PCP - General (Geriatric Medicine)  Advanced Directive information Does Patient Have a Medical Advance Directive?: No, Would patient like information on creating a medical advance directive?: No - Patient declined  Allergies  Allergen Reactions   Penicillins Rash    Chief Complaint  Patient presents with   Acute Visit    Discuss switching Trulicity to [facility] due to weight gain.      HPI: Patient is a 57 y.o. male for discussion on diabetic medication.   He wants to change medication from trulicity to [facility]  He tried ozemepic but did not feel like it was helping him and therefore went back t trulicity.   He is walking 2 miles a day and trying to eat better but gaining weight.   He is having colonoscopy in September.   Needs to schedule diabetic eye exam   Review of Systems:  Review of Systems  Constitutional:  Negative for chills, fever and weight loss.  HENT:  Negative for tinnitus.   Respiratory:  Negative for cough, sputum production and shortness of breath.   Cardiovascular:  Negative for chest pain, palpitations and leg swelling.  Gastrointestinal:  Negative for abdominal pain, constipation, diarrhea and heartburn.  Genitourinary:  Negative for dysuria, frequency and urgency.  Musculoskeletal:  Negative for back pain, falls, joint pain and myalgias.  Skin: Negative.   Neurological:  Negative for dizziness and headaches.  Psychiatric/Behavioral:  Negative for depression and memory loss. The patient does not have insomnia.     Past Medical History:  Diagnosis Date   Anxiety    Diabetes (HCC)    High cholesterol    Low testosterone in male    Overweight    Past Surgical History:  Procedure Laterality Date   COLONOSCOPY  2018   IRIDOTOMY / IRIDECTOMY Bilateral 11/11/2020   Dr.Groat   None     Social History:   reports that he quit smoking about 2 years ago. His smoking use included  cigarettes. He started smoking about 32 years ago. He has a 15 pack-year smoking history. He has never used smokeless tobacco. He reports current alcohol use of about 7.0 standard drinks of alcohol per week. He reports that he does not use drugs.  Family History  Problem Relation Age of Onset   Stroke Mother    Diabetes Mother    Stroke Father    Esophageal cancer Neg Hx    Stomach cancer Neg Hx    Colon cancer Neg Hx     Medications: Patient's Medications  New Prescriptions   No medications on file  Previous Medications   DIAZEPAM (VALIUM) 5 MG TABLET    Take 1 tablet (5 mg total) by mouth 2 (two) times daily as needed.   DIPHENHYDRAMINE HCL, SLEEP, (SLEEP AID) 50 MG CAPS    Take 1 capsule by mouth at bedtime.   DULAGLUTIDE (TRULICITY) 0.75 MG/0.5ML SOPN    INJECT 0.75 MG SUBCUTANEOUSLY ONE TIME PER WEEK   IBUPROFEN (ADVIL) 200 MG TABLET    Take 200 mg by mouth as needed.   ROSUVASTATIN (CRESTOR) 40 MG TABLET    Take 1 tablet (40 mg total) by mouth daily.   SILDENAFIL (VIAGRA) 100 MG TABLET    TAKE 1 TABLET BY MOUTH AS NEEDED. FOR ERECTILE DYSFUNCTION.   TESTOSTERONE CYPIONATE (DEPOTESTOTERONE CYPIONATE) 100 MG/ML INJECTION    Inject 100 mg into the muscle once a week.  Modified Medications   No  medications on file  Discontinued Medications   No medications on file    Physical Exam:  There were no vitals filed for this visit. There is no height or weight on file to calculate BMI. Wt Readings from Last 3 Encounters:  08/29/22 224 lb (101.6 kg)  07/24/22 224 lb (101.6 kg)  05/24/22 217 lb (98.4 kg)    Physical Exam Constitutional:      Appearance: Normal appearance.  Neurological:     Mental Status: He is alert. Mental status is at baseline.  Psychiatric:        Mood and Affect: Mood normal.     Labs reviewed: Basic Metabolic Panel: Recent Labs    11/23/21 1407 05/24/22 1547  NA 138 137  K 4.1 4.1  CL 105 103  CO2 26 26  GLUCOSE 96 94  BUN 16 16   CREATININE 0.84 0.91  CALCIUM 8.9 9.4   Liver Function Tests: Recent Labs    11/23/21 1407 05/24/22 1547  AST 33 34  ALT 44 52*  BILITOT 0.8 0.7  PROT 7.7 7.6   No results for input(s): "LIPASE", "AMYLASE" in the last 8760 hours. No results for input(s): "AMMONIA" in the last 8760 hours. CBC: Recent Labs    11/23/21 1407 05/24/22 1547 08/29/22 1513  WBC 4.5 5.6 5.5  NEUTROABS 2,516 3,354 3.5  HGB 15.8 15.9 16.6  HCT 47.1 48.1 50.3  MCV 83.8 81.5 81.9  PLT 113* 118* 139.0*   Lipid Panel: Recent Labs    11/23/21 1407 05/24/22 1547  CHOL 99 92  HDL 32* 32*  LDLCALC 47 38  TRIG 122 132  CHOLHDL 3.1 2.9   TSH: No results for input(s): "TSH" in the last 8760 hours. A1C: Lab Results  Component Value Date   HGBA1C 6.1 (H) 05/24/2022     Assessment/Plan 1. Type 2 diabetes mellitus with other specified complication, without long-term current use of insulin (HCC) -he would like to try mounjaro to help with weight loss and diabetic control.  Encouraged dietary compliance, routine foot care/monitoring and to keep up with diabetic eye exams through ophthalmology  - tirzepatide [facility]) 2.5 MG/0.5ML Pen; Inject 2.5 mg into the skin once a week.  Dispense: 2 mL; Refill: 1  2. Obesity BMI of 34 --education provided on healthy weight loss through increase in physical activity and proper nutrition   To keep follow up as scheduled Cesia Orf K. Biagio Borg  [facility] [phone]    Virtual Visit via mychart video  I connected with patient on 09/17/22 at 11:00 AM EDT by 15 and verified that I am speaking with the correct person using two identifiers.  Location: Patient: home Provider:  [facility]   I discussed the limitations, risks, security and privacy concerns of performing an evaluation and management service by telephone and the availability of in person appointments. I also discussed with the patient that there may be a  patient responsible charge related to this service. The patient expressed understanding and agreed to proceed.   I discussed the assessment and treatment plan with the patient. The patient was provided an opportunity to ask questions and all were answered. The patient agreed with the plan and demonstrated an understanding of the instructions.   The patient was advised to call back or seek an in-person evaluation if the symptoms worsen or if the condition fails to improve as anticipated.  I provided 15 minutes of non-face-to-face time during this encounter.  Janene Harvey.  Biagio Borg Avs printed and mailed

## 2022-09-17 NOTE — Progress Notes (Signed)
  This service is provided via telemedicine  No vital signs collected/recorded due to the encounter was a telemedicine visit.   Location of patient (ex: home, work):  Home  Patient consents to a telephone visit:  Yes  Location of the provider (ex: office, home):  Office [place].   Name of any referring provider:  na  Names of all persons participating in the telemedicine service and their role in the encounter:  Lesly Dukes, Patient, Nelda Severe, CMA, Abbey Chatters, NP  Time spent on call:  7:16

## 2022-09-18 ENCOUNTER — Encounter: Payer: Self-pay | Admitting: Gastroenterology

## 2022-09-19 ENCOUNTER — Telehealth: Payer: Self-pay | Admitting: *Deleted

## 2022-09-19 NOTE — Telephone Encounter (Signed)
Called patient in reference to the message received today. Patient was informed that there was nothing noted in his chart stating " cancel colonoscopy or do more test.' Nurse was unable to find any notation stating this. Patient states he was confused about receiving information about having more test done, need to ask for work and wanted to know if this was something that was needed at this time. Patient was informed that he has already taken the [place] ABD, that initially he was not interested in taking, but patient did follow through with this procedure. Patient was also informed the only procedure at this time is the already scheduled colonoscopy on 10/17/22. A colonoscopy was previously scheduled, but he cancelled that procedure on 10/11/22; and rescheduled the procedure for 10/17/22. Patient is wanting to know when he will hear anything in regards to the results for the [place] ABD. Informed the patient he will be notified as soon as the reading is done via Dr. Tomasa Rand. Please advise.

## 2022-09-19 NOTE — Telephone Encounter (Signed)
See phone note

## 2022-09-22 ENCOUNTER — Other Ambulatory Visit: Payer: Self-pay | Admitting: Nurse Practitioner

## 2022-09-22 DIAGNOSIS — E782 Mixed hyperlipidemia: Secondary | ICD-10-CM

## 2022-09-22 DIAGNOSIS — E1169 Type 2 diabetes mellitus with other specified complication: Secondary | ICD-10-CM

## 2022-09-22 NOTE — Progress Notes (Signed)
Mr. 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  A CT was recommended for further characterization of this lesion.  The enlarged spleen may explain why your platelet counts are low. The ultrasound also showed fatty change to the liver.  Fatty liver can be caused by excessive alcohol use but can also be secondary to diet and exercise habits.  Most patients with fatty liver will not go on to develop significant liver disease, but a small percentage of patients can develop cirrhosis.  I would recommend you limit alcohol consumption and make efforts to lose weight through improved diet and increasing physical activity.  Bonita Quin,  Can you please order a CT abdomen with IV contrast (no po contrast needed) to further characterize splenic lesion?

## 2022-09-23 ENCOUNTER — Other Ambulatory Visit: Payer: Self-pay

## 2022-09-23 DIAGNOSIS — D7389 Other diseases of spleen: Secondary | ICD-10-CM

## 2022-09-23 NOTE — Telephone Encounter (Signed)
Left message for pt to call back  °

## 2022-09-23 NOTE — Telephone Encounter (Signed)
Please notify patient of this, he requested to change to ozempic

## 2022-09-23 NOTE — Telephone Encounter (Signed)
MyChart message sent to patient.

## 2022-09-23 NOTE — Telephone Encounter (Signed)
Awaiting to hear from patient

## 2022-09-23 NOTE — Telephone Encounter (Signed)
Pt has not heard from rad scheduling to set up the CT scan. Pt given the phone number [phone] to call and set up appt. Pt also wanted to know if he was supposed to proceed with colon as scheduled. Discussed with pt that he is supposed to proceed with colon as scheduled.

## 2022-09-23 NOTE — Telephone Encounter (Signed)
Pharmacy comment: Product Backordered/Unavailable:PRESCRIBED PRODUCT NOT IN STOCK. PLEASE CONSIDER THE COST-EFFECTIVE POTENTIAL ALTERNATIVE(S) LISTED AND EVALUATE IF APPROPRIATE FOR YOUR PATIENT'S INDICATION AND TREATMENT GOALS.

## 2022-10-03 ENCOUNTER — Ambulatory Visit (HOSPITAL_COMMUNITY)
Admission: RE | Admit: 2022-10-03 | Discharge: 2022-10-03 | Disposition: A | Discharge status: Home / Self Care | Payer: BC Managed Care – PPO | Source: Ambulatory Visit | Attending: Gastroenterology | Admitting: Gastroenterology

## 2022-10-03 DIAGNOSIS — D7389 Other diseases of spleen: Secondary | ICD-10-CM | POA: Insufficient documentation

## 2022-10-03 DIAGNOSIS — R932 Abnormal findings on diagnostic imaging of liver and biliary tract: Secondary | ICD-10-CM | POA: Diagnosis not present

## 2022-10-03 DIAGNOSIS — N281 Cyst of kidney, acquired: Secondary | ICD-10-CM | POA: Diagnosis not present

## 2022-10-03 DIAGNOSIS — R161 Splenomegaly, not elsewhere classified: Secondary | ICD-10-CM | POA: Diagnosis not present

## 2022-10-03 LAB — POCT I-STAT CREATININE: Creatinine, Ser: 1 mg/dL (ref 0.61–1.24)

## 2022-10-03 MED ORDER — IOHEXOL 300 MG/ML  SOLN
100.0000 mL | Freq: Once | INTRAMUSCULAR | Status: AC | PRN
  Administered 2022-10-03: 100 mL via INTRAVENOUS

## 2022-10-04 ENCOUNTER — Encounter: Payer: Self-pay | Admitting: Gastroenterology

## 2022-10-11 ENCOUNTER — Encounter: Payer: BC Managed Care – PPO | Admitting: Gastroenterology

## 2022-10-11 NOTE — Progress Notes (Signed)
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 The patient states that he previously underwent a similar evaluation.  He does not recall when it was, but it may have been when he was in [place]. Specifically, he recalls being told he had fatty liver on ultrasound, and then getting a CT scan which showed a "lesion" in the spleen. He states that the doctor was Dr. Thomasena Edis from [facility] system.  Their phone number is [phone]. If we can get the imaging results from this office to confirm that the splenic lesion was indeed present then, we may not need to do further evaluation.  Bonita Quin, can you please reach out to this office and see if they can fax over his CT and ultrasound results?

## 2022-10-16 ENCOUNTER — Telehealth: Payer: Self-pay | Admitting: Gastroenterology

## 2022-10-16 NOTE — Telephone Encounter (Signed)
Good Morning Dr. Tomasa Rand,  Patient called stating he needed to cancel his procedure with you tomorrow at 1:30 due to family matters.   Patient stated he will call back at a later time to reschedule.

## 2022-10-17 ENCOUNTER — Encounter: Payer: BC Managed Care – PPO | Admitting: Gastroenterology

## 2022-10-21 NOTE — Telephone Encounter (Signed)
Message routed to PCP Eubanks, Jessica K, NP  

## 2022-11-06 ENCOUNTER — Encounter: Payer: Self-pay | Admitting: Nurse Practitioner

## 2022-11-06 DIAGNOSIS — E1169 Type 2 diabetes mellitus with other specified complication: Secondary | ICD-10-CM

## 2022-11-06 NOTE — Telephone Encounter (Signed)
If Sharon Seller, NP is in agreement with request, rx pended for review

## 2022-11-07 MED ORDER — TIRZEPATIDE 2.5 MG/0.5ML ~~LOC~~ SOAJ: 2.5000 mg | SUBCUTANEOUS | 1 refills | Status: DC

## 2022-11-22 ENCOUNTER — Ambulatory Visit: Payer: BC Managed Care – PPO | Admitting: Nurse Practitioner

## 2022-11-25 ENCOUNTER — Other Ambulatory Visit: Payer: Self-pay | Admitting: *Deleted

## 2022-11-25 DIAGNOSIS — E782 Mixed hyperlipidemia: Secondary | ICD-10-CM

## 2022-11-25 MED ORDER — ROSUVASTATIN CALCIUM 40 MG PO TABS: 40.0000 mg | ORAL_TABLET | Freq: Every day | ORAL | 1 refills | Status: DC

## 2022-11-25 NOTE — Telephone Encounter (Signed)
Patient requested refill

## 2022-12-11 ENCOUNTER — Telehealth: Payer: Self-pay | Admitting: Adult Health

## 2022-12-11 NOTE — Telephone Encounter (Signed)
I called pt to r/s appt at 1:19p.  He requested a refill of Diazepam to   [facility] [phone] - [place], [place] - [address] [place], [place] [address] Phone: [phone]  Fax: [phone]   Next appt 1/8

## 2022-12-11 NOTE — Telephone Encounter (Signed)
LF 10/9, due 11/6

## 2022-12-12 NOTE — Telephone Encounter (Signed)
LVM @ 9:58a that medicine wouldn't be sent until 11/6 to intercept any subsequent phone calls back to [place].

## 2022-12-18 ENCOUNTER — Other Ambulatory Visit: Payer: Self-pay

## 2022-12-18 DIAGNOSIS — F411 Generalized anxiety disorder: Secondary | ICD-10-CM

## 2022-12-18 MED ORDER — DIAZEPAM 5 MG PO TABS: 5.0000 mg | ORAL_TABLET | Freq: Two times a day (BID) | ORAL | 2 refills | Status: DC | PRN

## 2022-12-18 NOTE — Telephone Encounter (Signed)
Pended.

## 2022-12-24 ENCOUNTER — Other Ambulatory Visit: Payer: Self-pay

## 2022-12-24 DIAGNOSIS — D7389 Other diseases of spleen: Secondary | ICD-10-CM

## 2023-01-07 ENCOUNTER — Encounter: Payer: Self-pay | Admitting: Nurse Practitioner

## 2023-02-03 ENCOUNTER — Ambulatory Visit (INDEPENDENT_AMBULATORY_CARE_PROVIDER_SITE_OTHER): Payer: Self-pay | Admitting: Nurse Practitioner

## 2023-02-03 VITALS — BP 134/88 | HR 79 | Temp 97.3°F | Ht 68.0 in | Wt 228.0 lb

## 2023-02-03 DIAGNOSIS — E782 Mixed hyperlipidemia: Secondary | ICD-10-CM

## 2023-02-03 DIAGNOSIS — E1169 Type 2 diabetes mellitus with other specified complication: Secondary | ICD-10-CM

## 2023-02-03 MED ORDER — ROSUVASTATIN CALCIUM 40 MG PO TABS: 40.0000 mg | ORAL_TABLET | Freq: Every day | ORAL | 1 refills | Status: DC

## 2023-02-03 MED ORDER — TIRZEPATIDE 5 MG/0.5ML ~~LOC~~ SOAJ: 5.0000 mg | SUBCUTANEOUS | 1 refills | Status: DC

## 2023-02-03 NOTE — Progress Notes (Signed)
Careteam: Patient Care Team: Sharon Seller, NP as PCP - General (Geriatric Medicine)  PLACE OF SERVICE:  [facility]  Advanced Directive information    Allergies  Allergen Reactions   Penicillins Rash    Chief Complaint  Patient presents with   Follow-up    Discuss changing dose of Mounjero and refills on medications.      HPI: Patient is a 57 y.o. male for routine follow up .  He got a new job in the new year- will start January 1, and will get insurance feb1.  He is out of all medication.  He has been taking mounjaro and request refill on this.  He wants to go up on his mounjaro.  He wants tighter controlled on his diabetes and to help with weight loss  Does well with medication, no side effects.  He is currently taking mounjaro   He plans to follow up with GI after he gets insurance back.  He has a lesion in his spleen that needs to follow up.  Review of Systems:  Review of Systems  Constitutional:  Negative for chills, fever and weight loss.  HENT:  Negative for tinnitus.   Respiratory:  Negative for cough, sputum production and shortness of breath.   Cardiovascular:  Negative for chest pain, palpitations and leg swelling.  Gastrointestinal:  Negative for abdominal pain, constipation, diarrhea and heartburn.  Genitourinary:  Negative for dysuria, frequency and urgency.  Musculoskeletal:  Negative for back pain, falls, joint pain and myalgias.  Skin: Negative.   Neurological:  Negative for dizziness and headaches.  Psychiatric/Behavioral:  Negative for depression and memory loss. The patient does not have insomnia.     Past Medical History:  Diagnosis Date   Anxiety    Diabetes (HCC)    High cholesterol    Low testosterone in male    Overweight    Past Surgical History:  Procedure Laterality Date   COLONOSCOPY  2018   IRIDOTOMY / IRIDECTOMY Bilateral 11/11/2020   Dr.Groat   None     Social History:   reports that he quit smoking about 2  years ago. His smoking use included cigarettes. He started smoking about 33 years ago. He has a 15 pack-year smoking history. He has never used smokeless tobacco. He reports current alcohol use of about 7.0 standard drinks of alcohol per week. He reports that he does not use drugs.  Family History  Problem Relation Age of Onset   Stroke Mother    Diabetes Mother    Stroke Father    Esophageal cancer Neg Hx    Stomach cancer Neg Hx    Colon cancer Neg Hx     Medications: Patient's Medications  New Prescriptions   No medications on file  Previous Medications   DIAZEPAM (VALIUM) 5 MG TABLET    Take 1 tablet (5 mg total) by mouth 2 (two) times daily as needed.   DIPHENHYDRAMINE HCL, SLEEP, (SLEEP AID) 50 MG CAPS    Take 1 capsule by mouth at bedtime.   IBUPROFEN (ADVIL) 200 MG TABLET    Take 200 mg by mouth as needed.   ROSUVASTATIN (CRESTOR) 40 MG TABLET    Take 1 tablet (40 mg total) by mouth daily.   SILDENAFIL (VIAGRA) 100 MG TABLET    TAKE 1 TABLET BY MOUTH AS NEEDED. FOR ERECTILE DYSFUNCTION.   TESTOSTERONE CYPIONATE (DEPOTESTOTERONE CYPIONATE) 100 MG/ML INJECTION    Inject 100 mg into the muscle once a week.  TIRZEPATIDE (MOUNJARO) 2.5 MG/0.5ML PEN    Inject 2.5 mg into the skin once a week.  Modified Medications   No medications on file  Discontinued Medications   No medications on file    Physical Exam:  Vitals:   02/03/23 1521  BP: 134/88  Pulse: 79  Temp: (!) 97.3 F (36.3 C)  TempSrc: Temporal  SpO2: 97%  Weight: 228 lb (103.4 kg)  Height: 5\' 8"  (1.727 m)   Body mass index is 34.67 kg/m. Wt Readings from Last 3 Encounters:  02/03/23 228 lb (103.4 kg)  08/29/22 224 lb (101.6 kg)  07/24/22 224 lb (101.6 kg)    Physical Exam Constitutional:      General: He is not in acute distress.    Appearance: He is well-developed. He is not diaphoretic.  HENT:     Head: Normocephalic and atraumatic.     Right Ear: External ear normal.     Left Ear: External ear  normal.     Mouth/Throat:     Pharynx: No oropharyngeal exudate.  Eyes:     Conjunctiva/sclera: Conjunctivae normal.     Pupils: Pupils are equal, round, and reactive to light.  Cardiovascular:     Rate and Rhythm: Normal rate and regular rhythm.     Heart sounds: Normal heart sounds.  Pulmonary:     Effort: Pulmonary effort is normal.     Breath sounds: Normal breath sounds.  Abdominal:     General: Bowel sounds are normal.     Palpations: Abdomen is soft.  Musculoskeletal:        General: No tenderness.     Cervical back: Normal range of motion and neck supple.     Right lower leg: No edema.     Left lower leg: No edema.  Skin:    General: Skin is warm and dry.  Neurological:     Mental Status: He is alert and oriented to person, place, and time.     Labs reviewed: Basic Metabolic Panel: Recent Labs    05/24/22 1547 10/03/22 0905  NA 137  --   K 4.1  --   CL 103  --   CO2 26  --   GLUCOSE 94  --   BUN 16  --   CREATININE 0.91 1.00  CALCIUM 9.4  --    Liver Function Tests: Recent Labs    05/24/22 1547  AST 34  ALT 52*  BILITOT 0.7  PROT 7.6   No results for input(s): "LIPASE", "AMYLASE" in the last 8760 hours. No results for input(s): "AMMONIA" in the last 8760 hours. CBC: Recent Labs    05/24/22 1547 08/29/22 1513  WBC 5.6 5.5  NEUTROABS 3,354 3.5  HGB 15.9 16.6  HCT 48.1 50.3  MCV 81.5 81.9  PLT 118* 139.0*   Lipid Panel: Recent Labs    05/24/22 1547  CHOL 92  HDL 32*  LDLCALC 38  TRIG 295  CHOLHDL 2.9   TSH: No results for input(s): "TSH" in the last 8760 hours. A1C: Lab Results  Component Value Date   HGBA1C 6.1 (H) 05/24/2022     Assessment/Plan 1. Mixed hyperlipidemia (Primary) Continue medication with dietary modifications - COMPLETE METABOLIC PANEL WITH GFR - rosuvastatin (CRESTOR) 40 MG tablet; Take 1 tablet (40 mg total) by mouth daily.  Dispense: 90 tablet; Refill: 1  2. Type 2 diabetes mellitus with other specified  complication, without long-term current use of insulin (HCC) -Encouraged dietary compliance, routine foot care/monitoring and to  keep up with diabetic eye exams through ophthalmology  - COMPLETE METABOLIC PANEL WITH GFR - Hemoglobin A1c - tirzepatide (MOUNJARO) 5 MG/0.5ML Pen; Inject 5 mg into the skin once a week.  Dispense: 6 mL; Refill: 1  Return in about 3 months (around 05/04/2023) for routine follow up.:  Eligah Anello K. Biagio Borg [facility] [phone]

## 2023-02-04 LAB — HEMOGLOBIN A1C
Hgb A1c MFr Bld: 6.5 %{Hb} — ABNORMAL HIGH (ref <5.7)
Mean Plasma Glucose: 140 mg/dL
eAG (mmol/L): 7.7 mmol/L

## 2023-02-04 LAB — COMPLETE METABOLIC PANEL WITH GFR
AG Ratio: 1.3 (calc) (ref 1.0–2.5)
ALT: 88 U/L — ABNORMAL HIGH (ref 9–46)
AST: 50 U/L — ABNORMAL HIGH (ref 10–35)
Albumin: 4.6 g/dL (ref 3.6–5.1)
Alkaline phosphatase (APISO): 83 U/L (ref 35–144)
BUN: 17 mg/dL (ref 7–25)
CO2: 26 mmol/L (ref 20–32)
Calcium: 9.6 mg/dL (ref 8.6–10.3)
Chloride: 101 mmol/L (ref 98–110)
Creat: 0.81 mg/dL (ref 0.70–1.30)
Globulin: 3.5 g/dL (ref 1.9–3.7)
Glucose, Bld: 112 mg/dL — ABNORMAL HIGH (ref 65–99)
Potassium: 4.3 mmol/L (ref 3.5–5.3)
Sodium: 136 mmol/L (ref 135–146)
Total Bilirubin: 0.8 mg/dL (ref 0.2–1.2)
Total Protein: 8.1 g/dL (ref 6.1–8.1)
eGFR: 103 mL/min/{1.73_m2} (ref >=60)

## 2023-02-07 ENCOUNTER — Telehealth: Payer: Self-pay | Admitting: Nurse Practitioner

## 2023-02-07 NOTE — Telephone Encounter (Signed)
Patient has been calling and we've been calling him but keep missing each other. He asked me to message you that he has seen results and agrees with them. He stated that he will control his diet  and take his medicine.

## 2023-02-18 ENCOUNTER — Encounter: Payer: Self-pay | Admitting: Nurse Practitioner

## 2023-02-19 ENCOUNTER — Telehealth: Payer: Self-pay | Admitting: Adult Health

## 2023-03-04 ENCOUNTER — Telehealth: Payer: Self-pay

## 2023-03-04 NOTE — Telephone Encounter (Signed)
Patient paper printed and place in provider review and sign folder.

## 2023-03-04 NOTE — Telephone Encounter (Signed)
I had to call and verball start PA for medication Mounjaro. This couldn't be done through Cover My Meds. Fax will be sent over for PCP Janyth Contes Janene Harvey, NP to fill out

## 2023-03-12 ENCOUNTER — Telehealth: Payer: Self-pay

## 2023-03-12 NOTE — Telephone Encounter (Signed)
Patient called to question the status of his prior authorization.  It appears Jasmine placed paperwork on Sharon Seller, NP desk last week. Please advise (I am unable to locate paperwork)

## 2023-03-13 NOTE — Telephone Encounter (Signed)
Called [facility] and spoke with Shanda Bumps and she states she did not received the paperwork so it was re faxed.

## 2023-03-13 NOTE — Telephone Encounter (Signed)
Signed and scanned in under 03/04/2023

## 2023-03-14 NOTE — Telephone Encounter (Signed)
Patient returned Brooke's call, patient aware information re-submitted. Patient was given the number per his request to follow-up with [facility] if needed

## 2023-03-14 NOTE — Telephone Encounter (Signed)
Attempted to call patient about his PA. [facility] asked for additional notes that were faxed yesterday.

## 2023-03-17 ENCOUNTER — Telehealth: Payer: Self-pay | Admitting: *Deleted

## 2023-03-17 NOTE — Telephone Encounter (Signed)
rom: Channing Mutters  Sent: 03/17/2023   1:17 PM EST  To: Sharon Seller, NP  Subject: Needs Diabetes medicine                        This patient called from [place] which is 17 hours ahead he said there was a delay in getting his medicine for his diabetes and only has 2 doses left. He can send someone over to [place]  and it takes 4 to 6 weeks to ship through [facility], but a friend can come and pick it up at our office since it is urgent. He said the authorization was delayed and it costs him $100 to ship Please advise.  When he called earlier it was around 75 and Synetta Fail being in [place] was on the phone and he couldn't leave a message because of the number they would have to call him back on. Thank you

## 2023-03-17 NOTE — Telephone Encounter (Signed)
Tried calling patient. LMOM to return call.  °

## 2023-03-17 NOTE — Telephone Encounter (Signed)
I called [facility] [phone] and spoke with Shanda Bumps and she stated that there are more Clinical Questions that the Clinical team needs answered.   Shanda Bumps stated that she will fax over the questions needing answered and to fax it back once completed.   Awaiting Fax.

## 2023-03-18 ENCOUNTER — Telehealth: Payer: Self-pay

## 2023-03-18 NOTE — Telephone Encounter (Signed)
I have patient form questions placed in PCP review and sign folder.

## 2023-03-18 NOTE — Telephone Encounter (Signed)
 Message left on clinical intake voicemail:    Patient needs help (ok to respond via mychart), patient would like to know that we received additional questions regarding PA, answered them, and sent back. Patient emphasized he needs this handled ASAP, for he only has 2 injections lift of Mounjaro 

## 2023-03-20 ENCOUNTER — Telehealth: Payer: Self-pay

## 2023-03-20 NOTE — Telephone Encounter (Signed)
 Attempted to call patient to let him know PA was approved for mounjaro 

## 2023-03-21 ENCOUNTER — Other Ambulatory Visit: Payer: Self-pay | Admitting: Adult Health

## 2023-03-21 DIAGNOSIS — F411 Generalized anxiety disorder: Secondary | ICD-10-CM

## 2023-03-21 NOTE — Telephone Encounter (Signed)
Sent MyChart message to schedule appt.

## 2023-03-21 NOTE — Telephone Encounter (Signed)
 Pt called and made an appt for 03/25/23

## 2023-03-25 ENCOUNTER — Telehealth: Payer: Self-pay | Admitting: Adult Health

## 2023-03-27 ENCOUNTER — Encounter: Payer: Self-pay | Admitting: Adult Health

## 2023-03-27 ENCOUNTER — Telehealth: Payer: BC Managed Care – PPO | Admitting: Adult Health

## 2023-03-27 DIAGNOSIS — F411 Generalized anxiety disorder: Secondary | ICD-10-CM | POA: Diagnosis not present

## 2023-03-27 MED ORDER — DIAZEPAM 5 MG PO TABS: 5.0000 mg | ORAL_TABLET | Freq: Two times a day (BID) | ORAL | 2 refills | Status: DC

## 2023-03-27 NOTE — Progress Notes (Signed)
James Woods [id number] [birth year] 58 y.o.  Virtual Visit via Video Note  I connected with pt @ on 03/27/23 at  2:00 PM EST by a video enabled telemedicine application and verified that I am speaking with the correct person using two identifiers.   I discussed the limitations of evaluation and management by telemedicine and the availability of in person appointments. The patient expressed understanding and agreed to proceed.  I discussed the assessment and treatment plan with the patient. The patient was provided an opportunity to ask questions and all were answered. The patient agreed with the plan and demonstrated an understanding of the instructions.   The patient was advised to call back or seek an in-person evaluation if the symptoms worsen or if the condition fails to improve as anticipated.  I provided 10 minutes of non-face-to-face time during this encounter.  The patient was located at home.  The provider was located at [facility].   Dorothyann Gibbs, NP   Subjective:   Patient ID:  James Woods is a 58 y.o. (DOB [birth year]) male.  Chief Complaint: No chief complaint on file.   HPI Ival Sedor presents for follow-up of MDD and GAD.   Describes mood today as "ok". Pleasant. Denies tearfulness. Mood symptoms - reports anxiety at times - more situational. Denies depression or irritability.  Reports stable interest and motivation. Denies panic attacks. Reports some worry, rumination and over thinking. Mood is consistent. Stating "I fell like I'm doing alright". Feels like the Valium continues to work well for him as needed. Taking medications as prescribed. Energy levels "ok". Active, does not have a regular exercise routine.   Enjoys some usual interests and activities. Has 2 grown children. Spending time with family. Appetite adequate. Weight gain - 220 pounds - elevated A1C. Taking Monjauro. Sleeps well most nights. Averages 6 to 7 hours. Focus and  concentration stable. Completing tasks. Managing aspects of household. Works full-time. Denies SI or HI.  Denies AH or VH. Denies substance use. Denies self harm.  Previous medications: Lamictal   Review of Systems:  Review of Systems  Musculoskeletal:  Negative for gait problem.  Neurological:  Negative for tremors.  Psychiatric/Behavioral:         Please refer to HPI    Medications: I have reviewed the patient's current medications.  Current Outpatient Medications  Medication Sig Dispense Refill   diazepam (VALIUM) 5 MG tablet TAKE 1 TABLET BY MOUTH 2 TIMES DAILY AS NEEDED. 60 tablet 0   diphenhydrAMINE HCl, Sleep, (SLEEP AID) 50 MG CAPS Take 1 capsule by mouth at bedtime.     ibuprofen (ADVIL) 200 MG tablet Take 200 mg by mouth as needed.     rosuvastatin (CRESTOR) 40 MG tablet Take 1 tablet (40 mg total) by mouth daily. 90 tablet 1   sildenafil (VIAGRA) 100 MG tablet TAKE 1 TABLET BY MOUTH AS NEEDED. FOR ERECTILE DYSFUNCTION. 6 tablet 11   testosterone cypionate (DEPOTESTOTERONE CYPIONATE) 100 MG/ML injection Inject 100 mg into the muscle once a week.     tirzepatide [facility]) 5 MG/0.5ML Pen Inject 5 mg into the skin once a week. 6 mL 1   No current facility-administered medications for this visit.    Medication Side Effects: None  Allergies:  Allergies  Allergen Reactions   Penicillins Rash    Past Medical History:  Diagnosis Date   Anxiety    Diabetes (HCC)    High cholesterol    Low testosterone in male    Overweight  Family History  Problem Relation Age of Onset   Stroke Mother    Diabetes Mother    Stroke Father    Esophageal cancer Neg Hx    Stomach cancer Neg Hx    Colon cancer Neg Hx     Social History   Socioeconomic History   Marital status: Single    Spouse name: Not on file   Number of children: 2   Years of education: Not on file   Highest education level: Not on file  Occupational History   Occupation: retail  Tobacco Use    Smoking status: Former    Current packs/day: 0.00    Average packs/day: 0.5 packs/day for 30.0 years (15.0 ttl pk-yrs)    Types: Cigarettes    Start date: 02/11/1990    Quit date: 02/12/2020    Years since quitting: 3.1   Smokeless tobacco: Never  Vaping Use   Vaping status: Some Days   Start date: 07/12/2020   Substances: Nicotine, Flavoring  Substance and Sexual Activity   Alcohol use: Yes    Alcohol/week: 7.0 standard drinks of alcohol    Types: 7 Cans of beer per week    Comment: occassionally   Drug use: Never   Sexual activity: Not on file  Other Topics Concern   Not on file  Social History Narrative   Tobacco use:  1/2 pack per day, 30 years   Alcohol use: None   Diet:     Do you drink/eat things with caffeine?  No.   Marital status: Married in 1993.    Do you live in a house, apartment, assisted living, condo, trailer, etc.)? House   Is it one or more stories? One story.   How many persons live in your home?  2   Do you have any pets in your home? (please list)  3 dogs   Highest level of education: - Some college.   Current or past profession:  Retail   Do you exercise:  No.       Advanced Directive   No Living Will, no DNR, no POA/HPOA      FUNCTIONAL STATUS   No difficulty with bathing, dressing, preparing food, eating, managing medications, finances. No difficulty with affording medications.    Social Drivers of Corporate investment banker Strain: Low Risk  (02/10/2018)   Received from [facility]   Overall Financial Resource Strain (CARDIA)    Difficulty of Paying Living Expenses: Not hard at all  Food Insecurity: Unknown (02/10/2018)   Received from [facility]   Hunger Vital Sign    Worried About Running Out of Food in the Last Year: Patient declined    Ran Out of Food in the Last Year: Patient declined  Transportation Needs: Unknown (02/10/2018)   Received from [facility]   [facility] - Transportation    Lack of  Transportation (Medical): Patient declined    Lack of Transportation (Non-Medical): Patient declined  Physical Activity: Not on file  Stress: Not on file  Social Connections: Not on file  Intimate Partner Violence: Not on file    Past Medical History, Surgical history, Social history, and Family history were reviewed and updated as appropriate.   Please see review of systems for further details on the patient's review from today.   Objective:   Physical Exam:  There were no vitals taken for this visit.  Physical Exam Constitutional:      General: He is not in acute distress.  Musculoskeletal:        General: No deformity.  Neurological:     Mental Status: He is alert and oriented to person, place, and time.     Coordination: Coordination normal.  Psychiatric:        Attention and Perception: Attention and perception normal. He does not perceive auditory or visual hallucinations.        Mood and Affect: Affect is not labile, blunt, angry or inappropriate.        Speech: Speech normal.        Behavior: Behavior normal.        Thought Content: Thought content normal. Thought content is not paranoid or delusional. Thought content does not include homicidal or suicidal ideation. Thought content does not include homicidal or suicidal plan.        Cognition and Memory: Cognition and memory normal.        Judgment: Judgment normal.     Comments: Insight intact     Lab Review:     Component Value Date/Time   NA 136 02/03/2023 1541   K 4.3 02/03/2023 1541   CL 101 02/03/2023 1541   CO2 26 02/03/2023 1541   GLUCOSE 112 (H) 02/03/2023 1541   BUN 17 02/03/2023 1541   CREATININE 0.81 02/03/2023 1541   CALCIUM 9.6 02/03/2023 1541   PROT 8.1 02/03/2023 1541   AST 50 (H) 02/03/2023 1541   ALT 88 (H) 02/03/2023 1541   BILITOT 0.8 02/03/2023 1541   GFRNONAA 84 06/01/2020 0813   GFRAA 97 06/01/2020 0813       Component Value Date/Time   WBC 5.5 08/29/2022 1513   RBC 6.14 (H)  08/29/2022 1513   HGB 16.6 08/29/2022 1513   HCT 50.3 08/29/2022 1513   PLT 139.0 (L) 08/29/2022 1513   MCV 81.9 08/29/2022 1513   MCH 26.9 (L) 05/24/2022 1547   MCHC 33.1 08/29/2022 1513   RDW 14.0 08/29/2022 1513   LYMPHSABS 1.3 08/29/2022 1513   MONOABS 0.5 08/29/2022 1513   EOSABS 0.2 08/29/2022 1513   BASOSABS 0.0 08/29/2022 1513    No results found for: "POCLITH", "LITHIUM"   No results found for: "PHENYTOIN", "PHENOBARB", "VALPROATE", "CBMZ"   .res Assessment: Plan:    Plan:  PDMP reviewed  Valium 5mg  twice daily - take 1/2 tablet up to 4 times.  RTC 6 months  10 minutes spent dedicated to the care of this patient on the date of this encounter to include pre-visit review of records, ordering of medication, post visit documentation, and face-to-face time with the patient discussing anxiety. Discussed continuing current medication regimen.  Patient advised to contact office with any questions, adverse effects, or acute worsening in signs and symptoms.  Discussed potential benefits, risk, and side effects of benzodiazepines to include potential risk of tolerance and dependence, as well as possible drowsiness.  Advised patient not to drive if experiencing drowsiness and to take lowest possible effective dose to minimize risk of dependence and tolerance.  There are no diagnoses linked to this encounter.   Please see After Visit Summary for patient specific instructions.  Future Appointments  Date Time Provider Department Center  03/27/2023  2:00 PM Deshauna Cayson, Thereasa Solo, NP [facility] None  06/13/2023  2:20 PM Sharon Seller, NP [facility] None    No orders of the defined types were placed in this encounter.     -------------------------------

## 2023-05-09 ENCOUNTER — Ambulatory Visit: Payer: Self-pay | Admitting: Nurse Practitioner

## 2023-06-06 ENCOUNTER — Other Ambulatory Visit: Payer: BC Managed Care – PPO

## 2023-06-06 DIAGNOSIS — E782 Mixed hyperlipidemia: Secondary | ICD-10-CM

## 2023-06-06 DIAGNOSIS — E1169 Type 2 diabetes mellitus with other specified complication: Secondary | ICD-10-CM

## 2023-06-07 LAB — CBC WITH DIFFERENTIAL/PLATELET
Absolute Lymphocytes: 1804 {cells}/uL (ref 850–3900)
Absolute Monocytes: 429 {cells}/uL (ref 200–950)
Basophils Absolute: 50 {cells}/uL (ref 0–200)
Basophils Relative: 0.9 %
Eosinophils Absolute: 314 {cells}/uL (ref 15–500)
Eosinophils Relative: 5.7 %
HCT: 50.6 % — ABNORMAL HIGH (ref 38.5–50.0)
Hemoglobin: 16.3 g/dL (ref 13.2–17.1)
MCH: 27.1 pg (ref 27.0–33.0)
MCHC: 32.2 g/dL (ref 32.0–36.0)
MCV: 84.1 fL (ref 80.0–100.0)
MPV: 9.5 fL (ref 7.5–12.5)
Monocytes Relative: 7.8 %
Neutro Abs: 2904 {cells}/uL (ref 1500–7800)
Neutrophils Relative %: 52.8 %
Platelets: 122 10*3/uL — ABNORMAL LOW (ref 140–400)
RBC: 6.02 10*6/uL — ABNORMAL HIGH (ref 4.20–5.80)
RDW: 14.2 % (ref 11.0–15.0)
Total Lymphocyte: 32.8 %
WBC: 5.5 10*3/uL (ref 3.8–10.8)

## 2023-06-07 LAB — LIPID PANEL
Cholesterol: 123 mg/dL (ref <200)
HDL: 37 mg/dL — ABNORMAL LOW (ref >=40)
LDL Cholesterol (Calc): 59 mg/dL
Non-HDL Cholesterol (Calc): 86 mg/dL (ref <130)
Total CHOL/HDL Ratio: 3.3 (calc) (ref <5.0)
Triglycerides: 198 mg/dL — ABNORMAL HIGH (ref <150)

## 2023-06-07 LAB — HEMOGLOBIN A1C
Hgb A1c MFr Bld: 6.4 % — ABNORMAL HIGH (ref <5.7)
Mean Plasma Glucose: 137 mg/dL
eAG (mmol/L): 7.6 mmol/L

## 2023-06-07 LAB — COMPLETE METABOLIC PANEL WITHOUT GFR
AG Ratio: 1.4 (calc) (ref 1.0–2.5)
ALT: 70 U/L — ABNORMAL HIGH (ref 9–46)
AST: 41 U/L — ABNORMAL HIGH (ref 10–35)
Albumin: 4.5 g/dL (ref 3.6–5.1)
Alkaline phosphatase (APISO): 70 U/L (ref 35–144)
BUN: 18 mg/dL (ref 7–25)
CO2: 28 mmol/L (ref 20–32)
Calcium: 9.2 mg/dL (ref 8.6–10.3)
Chloride: 101 mmol/L (ref 98–110)
Creat: 0.78 mg/dL (ref 0.70–1.30)
Globulin: 3.3 g/dL (ref 1.9–3.7)
Glucose, Bld: 126 mg/dL — ABNORMAL HIGH (ref 65–99)
Potassium: 4.2 mmol/L (ref 3.5–5.3)
Sodium: 138 mmol/L (ref 135–146)
Total Bilirubin: 0.7 mg/dL (ref 0.2–1.2)
Total Protein: 7.8 g/dL (ref 6.1–8.1)

## 2023-06-09 ENCOUNTER — Telehealth: Payer: Self-pay | Admitting: *Deleted

## 2023-06-09 ENCOUNTER — Ambulatory Visit: Payer: Self-pay | Admitting: Nurse Practitioner

## 2023-06-09 ENCOUNTER — Telehealth (INDEPENDENT_AMBULATORY_CARE_PROVIDER_SITE_OTHER): Payer: Self-pay | Admitting: Nurse Practitioner

## 2023-06-09 DIAGNOSIS — D696 Thrombocytopenia, unspecified: Secondary | ICD-10-CM | POA: Diagnosis not present

## 2023-06-09 DIAGNOSIS — R748 Abnormal levels of other serum enzymes: Secondary | ICD-10-CM | POA: Diagnosis not present

## 2023-06-09 DIAGNOSIS — F1090 Alcohol use, unspecified, uncomplicated: Secondary | ICD-10-CM | POA: Insufficient documentation

## 2023-06-09 DIAGNOSIS — E1169 Type 2 diabetes mellitus with other specified complication: Secondary | ICD-10-CM | POA: Diagnosis not present

## 2023-06-09 DIAGNOSIS — Z1212 Encounter for screening for malignant neoplasm of rectum: Secondary | ICD-10-CM

## 2023-06-09 DIAGNOSIS — F419 Anxiety disorder, unspecified: Secondary | ICD-10-CM

## 2023-06-09 DIAGNOSIS — Z1211 Encounter for screening for malignant neoplasm of colon: Secondary | ICD-10-CM

## 2023-06-09 DIAGNOSIS — E119 Type 2 diabetes mellitus without complications: Secondary | ICD-10-CM | POA: Insufficient documentation

## 2023-06-09 DIAGNOSIS — F5101 Primary insomnia: Secondary | ICD-10-CM

## 2023-06-09 DIAGNOSIS — E782 Mixed hyperlipidemia: Secondary | ICD-10-CM

## 2023-06-09 NOTE — Assessment & Plan Note (Signed)
 Has been using NSAID at night at bedtime to help sleep. Encouraged to stop as he is not having pain and educated on side effects associated with NSAID use

## 2023-06-09 NOTE — Assessment & Plan Note (Signed)
 Possibly related to liver disease,will follow up cbc with next labs No signs of bleeding or bruising To report if this should occur

## 2023-06-09 NOTE — Assessment & Plan Note (Signed)
 Encouraged cessation of ETOh not using tylenol. Low fat diet Will follow up labs at next appt

## 2023-06-09 NOTE — Assessment & Plan Note (Signed)
 LDL at goal on crestor  Continue dietary modifications

## 2023-06-09 NOTE — Assessment & Plan Note (Signed)
 Now with elevated liver enzymes, recommended cessation in ETOH use.

## 2023-06-09 NOTE — Telephone Encounter (Signed)
 Mr. James, Woods are scheduled for a virtual visit with your provider today.    Just as we do with appointments in the office, we must obtain your consent to participate.  Your consent will be active for this visit and any virtual visit you Jamilex Bohnsack have with one of our providers in the next 365 days.    If you have a MyChart account, I can also send a copy of this consent to you electronically.  All virtual visits are billed to your insurance company just like a traditional visit in the office.  As this is a virtual visit, video technology does not allow for your provider to perform a traditional examination.  This Elyzabeth Goatley limit your provider's ability to fully assess your condition.  If your provider identifies any concerns that need to be evaluated in person or the need to arrange testing such as labs, EKG, etc, we will make arrangements to do so.    Although advances in technology are sophisticated, we cannot ensure that it will always work on either your end or our end.  If the connection with a video visit is poor, we Mattheo Swindle have to switch to a telephone visit.  With either a video or telephone visit, we are not always able to ensure that we have a secure connection.   I need to obtain your verbal consent now.   Are you willing to proceed with your visit today?   James Woods has provided verbal consent on 06/09/2023 for a virtual visit (video or telephone).   MayBethena Brothers, [place] 06/09/2023  10:59 AM

## 2023-06-09 NOTE — Progress Notes (Signed)
 This service is provided via telemedicine  No vital signs collected/recorded due to the encounter was a telemedicine visit.   Location of patient (ex: home, work):  Home  Patient consents to a telephone visit:  Yes  Location of the provider (ex: office, home):  Office  Name of any referring provider:  na  Names of all persons participating in the telemedicine service and their role in the encounter:  Percell Boyers, Patient, Rexie Catena, CMA, [facility] NP  Time spent on call:  7:11

## 2023-06-09 NOTE — Assessment & Plan Note (Signed)
 Encouraged dietary compliance, routine foot care/monitoring and to keep up with diabetic eye exams through ophthalmology  A1c controlled on mounjaro  5 mg daily

## 2023-06-09 NOTE — Patient Instructions (Addendum)
 [facility]- [facility] [address] [facility] [address] [place], [place] [address]  Call and make appt with ophthalmology for diabetic eye exam o

## 2023-06-09 NOTE — Assessment & Plan Note (Signed)
Followed by psych

## 2023-06-09 NOTE — Progress Notes (Signed)
 Careteam: Patient Care Team: Verma Gobble, NP as PCP - General (Geriatric Medicine)  Advanced Directive information Does Patient Have a Medical Advance Directive?: No, Would patient like information on creating a medical advance directive?: No - Patient declined  Allergies  Allergen Reactions   Penicillins Rash    Chief Complaint  Patient presents with   Medical Management of Chronic Issues    Medical Management of Chronic Issues. 3 Month follow up (Virtual. ) To discuss need for Pneumonia, Covid, Zoster, Foot, Eye and Colonoscopy.      HPI: Patient is a 58 y.o. male for follow up Discussed the use of AI scribe software for clinical note transcription with the patient, who gave verbal consent to proceed.  History of Present Illness   James Woods is a 58 year old male with diabetes who presents for a three-month follow-up.  He has not scheduled his GI referral for a colonoscopy due to a previous gap in health insurance. His last colonoscopy was with Dr. Darol Elizabeth.  He consumes alcohol daily, averaging one to two cans of liquor. He denies taking Tylenol but uses Advil every night along with an over-the-counter sleep aid containing Benadryl to help him sleep faster.  Recent blood tests showed elevated liver function tests and a low platelet count of 122, which has been stable for over a year. No abnormal bruising or bleeding.   His cholesterol levels are well-controlled on Crestor , and his A1c has improved since the last visit.  No pain but uses Advil nightly to aid sleep.       Review of Systems:  Review of Systems  Constitutional:  Negative for chills, fever and weight loss.  HENT:  Negative for tinnitus.   Respiratory:  Negative for cough, sputum production and shortness of breath.   Cardiovascular:  Negative for chest pain, palpitations and leg swelling.  Gastrointestinal:  Negative for abdominal pain, constipation, diarrhea and heartburn.   Genitourinary:  Negative for dysuria, frequency and urgency.  Musculoskeletal:  Negative for back pain, falls, joint pain and myalgias.  Skin: Negative.   Neurological:  Negative for dizziness and headaches.  Psychiatric/Behavioral:  Negative for depression and memory loss. The patient does not have insomnia.     Past Medical History:  Diagnosis Date   Anxiety    Diabetes (HCC)    High cholesterol    Low testosterone  in male    Overweight    Past Surgical History:  Procedure Laterality Date   COLONOSCOPY  2018   IRIDOTOMY / IRIDECTOMY Bilateral 11/11/2020   Dr.Groat   None     Social History:   reports that he quit smoking about 3 years ago. His smoking use included cigarettes. He started smoking about 33 years ago. He has a 15 pack-year smoking history. He has never used smokeless tobacco. He reports current alcohol use of about 7.0 standard drinks of alcohol per week. He reports that he does not use drugs.  Family History  Problem Relation Age of Onset   Stroke Mother    Diabetes Mother    Stroke Father    Esophageal cancer Neg Hx    Stomach cancer Neg Hx    Colon cancer Neg Hx     Medications: Patient's Medications  New Prescriptions   No medications on file  Previous Medications   DIAZEPAM  (VALIUM ) 5 MG TABLET    Take 1 tablet (5 mg total) by mouth 2 (two) times daily.   DIPHENHYDRAMINE HCL, SLEEP, (SLEEP AID)  50 MG CAPS    Take 1 capsule by mouth at bedtime.   IBUPROFEN (ADVIL) 200 MG TABLET    Take 200 mg by mouth as needed.   ROSUVASTATIN  (CRESTOR ) 40 MG TABLET    Take 1 tablet (40 mg total) by mouth daily.   SILDENAFIL  (VIAGRA ) 100 MG TABLET    TAKE 1 TABLET BY MOUTH AS NEEDED. FOR ERECTILE DYSFUNCTION.   TESTOSTERONE  CYPIONATE (DEPOTESTOTERONE CYPIONATE) 100 MG/ML INJECTION    Inject 100 mg into the muscle once a week.   TIRZEPATIDE  (MOUNJARO ) 5 MG/0.5ML PEN    Inject 5 mg into the skin once a week.  Modified Medications   No medications on file   Discontinued Medications   No medications on file    Physical Exam:  There were no vitals filed for this visit. There is no height or weight on file to calculate BMI. Wt Readings from Last 3 Encounters:  02/03/23 228 lb (103.4 kg)  08/29/22 224 lb (101.6 kg)  07/24/22 224 lb (101.6 kg)    Physical Exam Constitutional:      Appearance: Normal appearance.  Neurological:     Mental Status: He is alert. Mental status is at baseline.  Psychiatric:        Mood and Affect: Mood normal.     Labs reviewed: Basic Metabolic Panel: Recent Labs    10/03/22 0905 02/03/23 1541 06/06/23 0948  NA  --  136 138  K  --  4.3 4.2  CL  --  101 101  CO2  --  26 28  GLUCOSE  --  112* 126*  BUN  --  17 18  CREATININE 1.00 0.81 0.78  CALCIUM   --  9.6 9.2   Liver Function Tests: Recent Labs    02/03/23 1541 06/06/23 0948  AST 50* 41*  ALT 88* 70*  BILITOT 0.8 0.7  PROT 8.1 7.8   No results for input(s): "LIPASE", "AMYLASE" in the last 8760 hours. No results for input(s): "AMMONIA" in the last 8760 hours. CBC: Recent Labs    08/29/22 1513 06/06/23 0948  WBC 5.5 5.5  NEUTROABS 3.5 2,904  HGB 16.6 16.3  HCT 50.3 50.6*  MCV 81.9 84.1  PLT 139.0* 122*   Lipid Panel: Recent Labs    06/06/23 0948  CHOL 123  HDL 37*  LDLCALC 59  TRIG 725*  CHOLHDL 3.3   TSH: No results for input(s): "TSH" in the last 8760 hours. A1C: Lab Results  Component Value Date   HGBA1C 6.4 (H) 06/06/2023     Assessment/Plan   Alcohol use Now with elevated liver enzymes, recommended cessation in ETOH use.   Diabetes mellitus (HCC) Encouraged dietary compliance, routine foot care/monitoring and to keep up with diabetic eye exams through ophthalmology  A1c controlled on mounjaro  5 mg daily   Elevated liver enzymes Encouraged cessation of ETOh not using tylenol. Low fat diet Will follow up labs at next appt  Mixed hyperlipidemia LDL at goal on crestor  Continue dietary  modifications  Platelets decreased (HCC) Possibly related to liver disease,will follow up cbc with next labs No signs of bleeding or bruising To report if this should occur  Primary insomnia Has been using NSAID at night at bedtime to help sleep. Encouraged to stop as he is not having pain and educated on side effects associated with NSAID use   Anxiety Followed by psych.    Number given for colorectal screening to call GI and schedule follow up  Next appt: 4  months with labs prior  James Woods  [facility] [phone]    Virtual Visit via video  I connected with patient on 06/09/23 at 11:00 AM EDT by mychart and verified that I am speaking with the correct person using two identifiers.  Location: Patient: passenger  Provider: [facility] clinic   I discussed the limitations, risks, security and privacy concerns of performing an evaluation and management service by telephone and the availability of in person appointments. I also discussed with the patient that there may be a patient responsible charge related to this service. The patient expressed understanding and agreed to proceed.   I discussed the assessment and treatment plan with the patient. The patient was provided an opportunity to ask questions and all were answered. The patient agreed with the plan and demonstrated an understanding of the instructions.   The patient was advised to call back or seek an in-person evaluation if the symptoms worsen or if the condition fails to improve as anticipated.  I provided 20 minutes of non-face-to-face time during this encounter.  Chrystel Barefield K. Denney Woods Avs printed and mailed

## 2023-06-13 ENCOUNTER — Ambulatory Visit: Payer: Self-pay | Admitting: Nurse Practitioner

## 2023-06-16 ENCOUNTER — Telehealth (INDEPENDENT_AMBULATORY_CARE_PROVIDER_SITE_OTHER): Admitting: Adult Health

## 2023-06-16 ENCOUNTER — Encounter: Payer: Self-pay | Admitting: Adult Health

## 2023-06-16 DIAGNOSIS — G4700 Insomnia, unspecified: Secondary | ICD-10-CM | POA: Diagnosis not present

## 2023-06-16 DIAGNOSIS — F331 Major depressive disorder, recurrent, moderate: Secondary | ICD-10-CM

## 2023-06-16 DIAGNOSIS — F411 Generalized anxiety disorder: Secondary | ICD-10-CM

## 2023-06-16 DIAGNOSIS — F410 Panic disorder [episodic paroxysmal anxiety] without agoraphobia: Secondary | ICD-10-CM | POA: Diagnosis not present

## 2023-06-16 MED ORDER — DIAZEPAM 5 MG PO TABS
5.0000 mg | ORAL_TABLET | Freq: Two times a day (BID) | ORAL | 2 refills | Status: DC
Start: 2023-06-16 — End: 2023-12-16

## 2023-06-16 MED ORDER — BUPROPION HCL ER (XL) 150 MG PO TB24: 150.0000 mg | ORAL_TABLET | Freq: Every day | ORAL | 2 refills | Status: DC

## 2023-06-16 NOTE — Progress Notes (Signed)
 James Woods [id number] [birth year] 58 y.o.  Virtual Visit via Video Note  I connected with pt @ on 06/16/23 at  4:00 PM EDT by a video enabled telemedicine application and verified that I am speaking with the correct person using two identifiers.   I discussed the limitations of evaluation and management by telemedicine and the availability of in person appointments. The patient expressed understanding and agreed to proceed.  I discussed the assessment and treatment plan with the patient. The patient was provided an opportunity to ask questions and all were answered. The patient agreed with the plan and demonstrated an understanding of the instructions.   The patient was advised to call back or seek an in-person evaluation if the symptoms worsen or if the condition fails to improve as anticipated.  I provided 40 minutes of non-face-to-face time during this encounter.  The patient was located at home.  The provider was located at [facility].   Reagan Camera, NP   Subjective:   Patient ID:  James Woods is a 58 y.o. (DOB January 19, 1966) male.  Chief Complaint: No chief complaint on file.   HPI James Woods presents for follow-up of MDD, panic attacks, insomnia and GAD.   Describes mood today as "not good". Pleasant. Flat. Reports tearfulness. Mood symptoms - reports depression, anxiety and irritability. Stating "I feel very emotional". Reports decreased interest and motivation. Reports panic attacks - 3 to 4 daily. Reports feeling overwhelmed. Stating "I can't function". Reports difficulties making informed decisions. Reports physical symptoms of a "severe knot in his throat - and chest tightness". Reports he is worried about losing his job and his family. Reports difficulties with critical thinking and decision making. Stating "I can't find any clarity". Reports worry, rumination and over thinking. Reports obsessive thoughts. Denies obsessive acts. Reports mood started to  decline over past few months and has gradually gotten worse. Reports he is worried about losing his job, but is unable to perform job functions with current mood instability. Reports Valium  is helpful, but would like to consider additional options to help stabilize mood. He has taken Wellbutrin previously and is willing to try again.   Energy levels lower. Active, does not have a regular exercise routine.   Unable to enjoy usual interests and activities. Lives with wife. Has 2 grown children. Spending time with family. Appetite decreased. Reports weight loss - 200 from 220 pounds - A1C normal. Taking Monjauro. Reports sleeping difficulties. Averages 4 hours over the past month. Reports difficulties with focus and concentration stable. Stating "I feel confused". Completing tasks. Managing aspects of household. Works full-time. Denies SI or HI.  Denies AH or VH. Denies substance use. Denies self harm. Consumes alcohol.  Previous medications: Lamictal  Review of Systems:  Review of Systems  Musculoskeletal:  Negative for gait problem.  Neurological:  Negative for tremors.  Psychiatric/Behavioral:         Please refer to HPI    Medications: I have reviewed the patient's current medications.  Current Outpatient Medications  Medication Sig Dispense Refill   diazepam  (VALIUM ) 5 MG tablet Take 1 tablet (5 mg total) by mouth 2 (two) times daily. 60 tablet 2   diphenhydrAMINE HCl, Sleep, (SLEEP AID) 50 MG CAPS Take 1 capsule by mouth at bedtime.     ibuprofen (ADVIL) 200 MG tablet Take 200 mg by mouth as needed.     rosuvastatin  (CRESTOR ) 40 MG tablet Take 1 tablet (40 mg total) by mouth daily. 90 tablet 1   sildenafil  (  VIAGRA ) 100 MG tablet TAKE 1 TABLET BY MOUTH AS NEEDED. FOR ERECTILE DYSFUNCTION. 6 tablet 11   testosterone  cypionate (DEPOTESTOTERONE CYPIONATE) 100 MG/ML injection Inject 100 mg into the muscle once a week.     tirzepatide  (MOUNJARO ) 5 MG/0.5ML Pen Inject 5 mg into the skin  once a week. 6 mL 1   No current facility-administered medications for this visit.    Medication Side Effects: None  Allergies:  Allergies  Allergen Reactions   Penicillins Rash    Past Medical History:  Diagnosis Date   Anxiety    Diabetes (HCC)    High cholesterol    Low testosterone  in male    Overweight     Family History  Problem Relation Age of Onset   Stroke Mother    Diabetes Mother    Stroke Father    Esophageal cancer Neg Hx    Stomach cancer Neg Hx    Colon cancer Neg Hx     Social History   Socioeconomic History   Marital status: Single    Spouse name: Not on file   Number of children: 2   Years of education: Not on file   Highest education level: Associate degree: academic program  Occupational History   Occupation: retail  Tobacco Use   Smoking status: Former    Current packs/day: 0.00    Average packs/day: 0.5 packs/day for 30.0 years (15.0 ttl pk-yrs)    Types: Cigarettes    Start date: 02/11/1990    Quit date: 02/12/2020    Years since quitting: 3.3   Smokeless tobacco: Never  Vaping Use   Vaping status: Some Days   Start date: 07/12/2020   Substances: Nicotine, Flavoring  Substance and Sexual Activity   Alcohol use: Yes    Alcohol/week: 7.0 standard drinks of alcohol    Types: 7 Cans of beer per week    Comment: occassionally   Drug use: Never   Sexual activity: Not on file  Other Topics Concern   Not on file  Social History Narrative   Tobacco use:  1/2 pack per day, 30 years   Alcohol use: None   Diet:     Do you drink/eat things with caffeine?  No.   Marital status: Married in 1993.    Do you live in a house, apartment, assisted living, condo, trailer, etc.)? House   Is it one or more stories? One story.   How many persons live in your home?  2   Do you have any pets in your home? (please list)  3 dogs   Highest level of education: - Some college.   Current or past profession:  Retail   Do you exercise:  No.       Advanced  Directive   No Living Will, no DNR, no POA/HPOA      FUNCTIONAL STATUS   No difficulty with bathing, dressing, preparing food, eating, managing medications, finances. No difficulty with affording medications.    Social Drivers of Corporate investment banker Strain: Low Risk  (06/09/2023)   Overall Financial Resource Strain (CARDIA)    Difficulty of Paying Living Expenses: Not very hard  Food Insecurity: No Food Insecurity (06/09/2023)   Hunger Vital Sign    Worried About Running Out of Food in the Last Year: Never true    Ran Out of Food in the Last Year: Never true  Transportation Needs: No Transportation Needs (06/09/2023)   PRAPARE - Transportation    Lack of Transportation (  Medical): No    Lack of Transportation (Non-Medical): No  Physical Activity: Insufficiently Active (06/09/2023)   Exercise Vital Sign    Days of Exercise per Week: 2 days    Minutes of Exercise per Session: 30 min  Stress: No Stress Concern Present (06/09/2023)   [employer] of Occupational Health - Occupational Stress Questionnaire    Feeling of Stress : Not at all  Social Connections: Moderately Isolated (06/09/2023)   Social Connection and Isolation Panel [NHANES]    Frequency of Communication with Friends and Family: Once a week    Frequency of Social Gatherings with Friends and Family: Never    Attends Religious Services: 1 to 4 times per year    Active Member of [employer] or Organizations: No    Attends Engineer, structural: Not on file    Marital Status: Married  Catering manager Violence: Not on file    Past Medical History, Surgical history, Social history, and Family history were reviewed and updated as appropriate.   Please see review of systems for further details on the patient's review from today.   Objective:   Physical Exam:  There were no vitals taken for this visit.  Physical Exam Constitutional:      General: He is not in acute distress. Musculoskeletal:        General:  No deformity.  Neurological:     Mental Status: He is alert and oriented to person, place, and time.     Coordination: Coordination normal.  Psychiatric:        Attention and Perception: Attention and perception normal. He does not perceive auditory or visual hallucinations.        Mood and Affect: Mood is anxious and depressed. Affect is flat and tearful. Affect is not labile, blunt, angry or inappropriate.        Speech: Speech normal.        Behavior: Behavior is withdrawn. Behavior is cooperative.        Thought Content: Thought content normal. Thought content is not paranoid or delusional. Thought content does not include homicidal or suicidal ideation. Thought content does not include homicidal or suicidal plan.        Cognition and Memory: Cognition and memory normal.        Judgment: Judgment normal.     Comments: Insight intact     Lab Review:     Component Value Date/Time   NA 138 06/06/2023 0948   K 4.2 06/06/2023 0948   CL 101 06/06/2023 0948   CO2 28 06/06/2023 0948   GLUCOSE 126 (H) 06/06/2023 0948   BUN 18 06/06/2023 0948   CREATININE 0.78 06/06/2023 0948   CALCIUM  9.2 06/06/2023 0948   PROT 7.8 06/06/2023 0948   AST 41 (H) 06/06/2023 0948   ALT 70 (H) 06/06/2023 0948   BILITOT 0.7 06/06/2023 0948   GFRNONAA 84 06/01/2020 0813   GFRAA 97 06/01/2020 0813       Component Value Date/Time   WBC 5.5 06/06/2023 0948   RBC 6.02 (H) 06/06/2023 0948   HGB 16.3 06/06/2023 0948   HCT 50.6 (H) 06/06/2023 0948   PLT 122 (L) 06/06/2023 0948   MCV 84.1 06/06/2023 0948   MCH 27.1 06/06/2023 0948   MCHC 32.2 06/06/2023 0948   RDW 14.2 06/06/2023 0948   LYMPHSABS 1.3 08/29/2022 1513   MONOABS 0.5 08/29/2022 1513   EOSABS 314 06/06/2023 0948   BASOSABS 50 06/06/2023 0948    No results found for: "POCLITH", "  LITHIUM"   No results found for: "PHENYTOIN", "PHENOBARB", "VALPROATE", "CBMZ"   .res Assessment: Plan:    Plan:  PDMP reviewed  Add Wellbutrin XL 150mg   daily - has taken previously - denies seizure history  Valium  5mg  twice daily   Will take Mr. Beauman out of work 06/16/2023 until 07/28/2023 for medication management and mood stabilization. He is totally disabled and unable to work at this time.  RTC 6 weeks  40 minutes spent dedicated to the care of this patient on the date of this encounter to include pre-visit review of records, ordering of medication, post visit documentation, and face-to-face time with the patient discussing anxiety. Discussed continuing current medication regimen.  Patient advised to contact office with any questions, adverse effects, or acute worsening in signs and symptoms.  Discussed potential benefits, risk, and side effects of benzodiazepines to include potential risk of tolerance and dependence, as well as possible drowsiness.  Advised patient not to drive if experiencing drowsiness and to take lowest possible effective dose to minimize risk of dependence and tolerance.  There are no diagnoses linked to this encounter.   Please see After Visit Summary for patient specific instructions.  Future Appointments  Date Time Provider Department Center  06/16/2023  4:00 PM Coltin Casher Nattalie, NP [facility] None    No orders of the defined types were placed in this encounter.     -------------------------------

## 2023-06-18 ENCOUNTER — Telehealth: Payer: Self-pay

## 2023-06-18 NOTE — Telephone Encounter (Signed)
 Pt contacted office to discuss his STD and also get a work note. His work note is completed and he is aware.  Left message to call back and discuss his STD, no paperwork received yet though.

## 2023-06-18 NOTE — Telephone Encounter (Signed)
 Pt called back and said his paperwork should be coming, informed him the admin staff will reach out to him once received.

## 2023-06-20 DIAGNOSIS — Z0289 Encounter for other administrative examinations: Secondary | ICD-10-CM

## 2023-06-20 NOTE — Telephone Encounter (Signed)
 Pt paid for paperwork needs filled out and sent by 06/27/23

## 2023-06-20 NOTE — Telephone Encounter (Signed)
 Received fax from [place] financial paperwork to complete for STD. Charge to complete $45.  Called Pt LVM to call office and pay $45 before forms will be completed.

## 2023-06-23 NOTE — Telephone Encounter (Signed)
 Lvm for pt and faxed form

## 2023-06-23 NOTE — Telephone Encounter (Signed)
 Pt paper work completed and signed. Will let pt know and check ok if able to fax.

## 2023-07-08 ENCOUNTER — Other Ambulatory Visit: Payer: Self-pay

## 2023-07-08 ENCOUNTER — Emergency Department (HOSPITAL_COMMUNITY): Admission: EM | Admit: 2023-07-08 | Discharge: 2023-07-08 | Disposition: A | Discharge status: Home / Self Care

## 2023-07-08 ENCOUNTER — Emergency Department (HOSPITAL_COMMUNITY)

## 2023-07-08 ENCOUNTER — Telehealth: Payer: Self-pay | Admitting: Gastroenterology

## 2023-07-08 ENCOUNTER — Encounter (HOSPITAL_COMMUNITY): Payer: Self-pay

## 2023-07-08 ENCOUNTER — Encounter: Payer: Self-pay | Admitting: Gastroenterology

## 2023-07-08 DIAGNOSIS — R1011 Right upper quadrant pain: Secondary | ICD-10-CM | POA: Diagnosis present

## 2023-07-08 DIAGNOSIS — E119 Type 2 diabetes mellitus without complications: Secondary | ICD-10-CM | POA: Diagnosis not present

## 2023-07-08 LAB — CBC WITH DIFFERENTIAL/PLATELET
Abs Immature Granulocytes: 0.05 10*3/uL (ref 0.00–0.07)
Basophils Absolute: 0 10*3/uL (ref 0.0–0.1)
Basophils Relative: 1 %
Eosinophils Absolute: 0.3 10*3/uL (ref 0.0–0.5)
Eosinophils Relative: 4 %
HCT: 47.5 % (ref 39.0–52.0)
Hemoglobin: 15.8 g/dL (ref 13.0–17.0)
Immature Granulocytes: 1 %
Lymphocytes Relative: 21 %
Lymphs Abs: 1.3 10*3/uL (ref 0.7–4.0)
MCH: 27.8 pg (ref 26.0–34.0)
MCHC: 33.3 g/dL (ref 30.0–36.0)
MCV: 83.5 fL (ref 80.0–100.0)
Monocytes Absolute: 0.5 10*3/uL (ref 0.1–1.0)
Monocytes Relative: 9 %
Neutro Abs: 3.9 10*3/uL (ref 1.7–7.7)
Neutrophils Relative %: 64 %
Platelets: 118 10*3/uL — ABNORMAL LOW (ref 150–400)
RBC: 5.69 MIL/uL (ref 4.22–5.81)
RDW: 15.6 % — ABNORMAL HIGH (ref 11.5–15.5)
WBC: 6 10*3/uL (ref 4.0–10.5)
nRBC: 0 % (ref 0.0–0.2)

## 2023-07-08 LAB — URINALYSIS, ROUTINE W REFLEX MICROSCOPIC
Bilirubin Urine: NEGATIVE
Glucose, UA: NEGATIVE mg/dL
Ketones, ur: NEGATIVE mg/dL
Leukocytes,Ua: NEGATIVE
Nitrite: NEGATIVE
Protein, ur: 30 mg/dL — AB
Specific Gravity, Urine: 1.015 (ref 1.005–1.030)
pH: 5 (ref 5.0–8.0)

## 2023-07-08 LAB — COMPREHENSIVE METABOLIC PANEL WITH GFR
ALT: 63 U/L — ABNORMAL HIGH (ref 0–44)
AST: 47 U/L — ABNORMAL HIGH (ref 15–41)
Albumin: 3.7 g/dL (ref 3.5–5.0)
Alkaline Phosphatase: 65 U/L (ref 38–126)
Anion gap: 11 (ref 5–15)
BUN: 11 mg/dL (ref 6–20)
CO2: 24 mmol/L (ref 22–32)
Calcium: 9 mg/dL (ref 8.9–10.3)
Chloride: 102 mmol/L (ref 98–111)
Creatinine, Ser: 0.95 mg/dL (ref 0.61–1.24)
GFR, Estimated: >60 mL/min (ref >60)
Glucose, Bld: 109 mg/dL — ABNORMAL HIGH (ref 70–99)
Potassium: 4.4 mmol/L (ref 3.5–5.1)
Sodium: 137 mmol/L (ref 135–145)
Total Bilirubin: 1.1 mg/dL (ref 0.0–1.2)
Total Protein: 7.7 g/dL (ref 6.5–8.1)

## 2023-07-08 LAB — LIPASE, BLOOD: Lipase: 43 U/L (ref 11–51)

## 2023-07-08 MED ORDER — IOHEXOL 350 MG/ML SOLN
75.0000 mL | Freq: Once | INTRAVENOUS | Status: AC | PRN
  Administered 2023-07-08: 75 mL via INTRAVENOUS

## 2023-07-08 NOTE — ED Provider Notes (Signed)
 [place] EMERGENCY DEPARTMENT AT [facility] Provider Note   CSN: [id number] Arrival date & time: 07/08/23  1153     History  No chief complaint on file.   James Woods is a 58 y.o. male.  The history is provided by the patient and medical records. No language interpreter was used.     58 year old male history of diabetes, hypercholesterolemia, alcohol use, depression, anxiety, presenting with complaint of abdominal pain.  Patient reports for the past 3 days he has had pain to his right side of abdomen.  Pain sometimes is quite intense follows with nausea.  There is no associate fever chills no chest pain shortness of breath productive cough no vomiting but he does feel constipated as well as having sensation of bloatedness.  He mention last year he was told that he has evidence of abnormal liver lesion and splenic lesions which they thought could be related to fatty liver.  He also had some elevated liver enzyme initially when he was seen by his PCP prompting his GI referral.  He was recommended to have repeat imaging to monitor his lesions but he did have to leave the country and did not have that done.  Today he voiced concerns about his discomfort and wanted to make sure that his pain is not related to his liver lesions.  He admits to regular alcohol use approximately 2 alcoholic beverage daily and he does vape.  He does endorse some weight gain approximate 30 pounds within the past 2 years but attributes to poor dietary habit.  He denies any active cancer.  He mention he was told that he has splenic lesions several years back and felt that the lesion is probably benign.  He report minimal pain at this time.  Denies any postprandial pain.  Home Medications Prior to Admission medications   Medication Sig Start Date End Date Taking? Authorizing Provider  buPROPion  (WELLBUTRIN  XL) 150 MG 24 hr tablet Take 1 tablet (150 mg total) by mouth daily. 06/16/23   Mozingo, Regina Nattalie,  NP  diazepam  (VALIUM ) 5 MG tablet Take 1 tablet (5 mg total) by mouth 2 (two) times daily. 06/16/23   Mozingo, Regina Nattalie, NP  diphenhydrAMINE HCl, Sleep, (SLEEP AID) 50 MG CAPS Take 1 capsule by mouth at bedtime.    [provider]  ibuprofen (ADVIL) 200 MG tablet Take 200 mg by mouth as needed.    [provider]  rosuvastatin  (CRESTOR ) 40 MG tablet Take 1 tablet (40 mg total) by mouth daily. 02/03/23   Verma Gobble, NP  sildenafil  (VIAGRA ) 100 MG tablet TAKE 1 TABLET BY MOUTH AS NEEDED. FOR ERECTILE DYSFUNCTION. 05/24/22   Verma Gobble, NP  testosterone  cypionate (DEPOTESTOTERONE CYPIONATE) 100 MG/ML injection Inject 100 mg into the muscle once a week. 06/02/20   [provider]  tirzepatide  (MOUNJARO ) 5 MG/0.5ML Pen Inject 5 mg into the skin once a week. 02/03/23   Verma Gobble, NP      Allergies    Penicillins    Review of Systems   Review of Systems  All other systems reviewed and are negative.   Physical Exam Updated Vital Signs BP 124/89 (BP Location: Right Arm)   Pulse 92   Temp 98.1 F (36.7 C)   Resp (!) 24   SpO2 98%  Physical Exam Constitutional:      General: He is not in acute distress.    Appearance: He is well-developed.  HENT:     Head:  Atraumatic.  Eyes:     Conjunctiva/sclera: Conjunctivae normal.  Cardiovascular:     Rate and Rhythm: Normal rate and regular rhythm.     Pulses: Normal pulses.     Heart sounds: Normal heart sounds.  Pulmonary:     Effort: Pulmonary effort is normal.     Breath sounds: Normal breath sounds.  Abdominal:     Palpations: Abdomen is soft.     Tenderness: There is abdominal tenderness (.  He does have some mild tenderness to the right upper quadrant without any guarding rebound tenderness.  Bowel sounds present.).  Musculoskeletal:     Cervical back: Normal range of motion and neck supple.  Skin:    Findings: No rash.  Neurological:     Mental Status: He is alert.     ED  Results / Procedures / Treatments   Labs (all labs ordered are listed, but only abnormal results are displayed) Labs Reviewed  CBC WITH DIFFERENTIAL/PLATELET - Abnormal; Notable for the following components:      Result Value   RDW 15.6 (*)    Platelets 118 (*)    All other components within normal limits  COMPREHENSIVE METABOLIC PANEL WITH GFR - Abnormal; Notable for the following components:   Glucose, Bld 109 (*)    AST 47 (*)    ALT 63 (*)    All other components within normal limits  URINALYSIS, ROUTINE W REFLEX MICROSCOPIC - Abnormal; Notable for the following components:   Hgb urine dipstick SMALL (*)    Protein, ur 30 (*)    Bacteria, UA RARE (*)    All other components within normal limits  LIPASE, BLOOD    EKG None  Radiology No results found.  Procedures Procedures    Medications Ordered in ED Medications  iohexol  (OMNIPAQUE ) 350 MG/ML injection 75 mL (75 mLs Intravenous Contrast Given 07/08/23 1418)    ED Course/ Medical Decision Making/ A&P Clinical Course as of 07/08/23 1604  Tue Jul 08, 2023  1601 Evaluated patient.  Benign abdominal exam.  Similar LFTs.  CT scan is still pending.  Patient requesting to leave.  Discussed CT scan still pending and do not have final results and my concern that there could be underlying infection, malignancy.  I explained the risk of leaving prior to CT scan results to include permanent disability or even death.  Patient has capacity and is clinically sober.  He understands the risks and would still like to leave.  As a next step he is going to follow-up on MyChart and call his primary doctor and GI doctor tomorrow.  Patient will be leaving AGAINST MEDICAL ADVICE at this time. [TY]    Clinical Course User Index [TY] Rolinda Climes, DO                                 Medical Decision Making  BP (!) 137/103 (BP Location: Right Arm)   Pulse 100   Temp 97.7 F (36.5 C)   Resp 16   SpO2 100%   65:16 PM  58 year old male  history of diabetes, hypercholesterolemia, alcohol use, depression, anxiety, presenting with complaint of abdominal pain.  Patient reports for the past 3 days he has had pain to his right side of abdomen.  Pain sometimes is quite intense follows with nausea.  There is no associate fever chills no chest pain shortness of breath productive cough no vomiting but he does  feel constipated as well as having sensation of bloatedness.  He mention last year he was told that he has evidence of abnormal liver lesion and splenic lesions which they thought could be related to fatty liver.  He also had some elevated liver enzyme initially when he was seen by his PCP prompting his GI referral.  He was recommended to have repeat imaging to monitor his lesions but he did have to leave the country and did not have that done.  Today he voiced concerns about his discomfort and wanted to make sure that his pain is not related to his liver lesions.  He admits to regular alcohol use approximately 2 alcoholic beverage daily and he does vape.  He does endorse some weight gain approximate 30 pounds within the past 2 years but attributes to poor dietary habit.  He denies any active cancer.  He mention he was told that he has splenic lesions several years back and felt that the lesion is probably benign.  He report minimal pain at this time.  Denies any postprandial pain.  On exam patient is resting comfortably appears to be in no acute discomfort.  Heart with normal rate and rhythm, lungs clear to auscultation bilaterally abdomen is soft, bowel sounds present, mild right upper quadrant tenderness no guarding or rebound tenderness noted.  No splenomegaly or hepatomegaly appreciated.  Vital sign overall reassuring no fever and hypoxia.  -Labs ordered, independently viewed and interpreted by me.  Labs remarkable for mildly elevated liver enzymes of AST 47, ALT 63, similar to prior.  Normal WBC, normal H&H.  Normal lipase.  Urinalysis  without signs of urinary infection. -The patient was maintained on a cardiac monitor.  I personally viewed and interpreted the cardiac monitored which showed an underlying rhythm of: Normal sinus rhythm -Imaging abdominal and pelvis CT scan have been obtained but have not resulted yet -This patient presents to the ED for concern of abdominal pain, this involves an extensive number of treatment options, and is a complaint that carries with it a high risk of complications and morbidity.  The differential diagnosis includes cholecystitis, pancreatitis appendicitis, malignancy, GERD, gastritis, UTI, kidney stone -Co morbidities that complicate the patient evaluation includes alcohol use, fatty liver -Treatment includes none -Reevaluation of the patient after these medicines showed that the patient stayed the same -PCP office notes or outside notes reviewed -Discussion with attending -Escalation to admission/observation considered: At this time patient request to be discharged and he would like to be notified of his CT scan results.  Unfortunately patient will need to leave AMA however he understands that he can return anytime for further care.  Patient is aware of risk of leaving AMA which include an delay in appropriate care, and potential death.  He is able to make informed decision.  He may also follow-up the result through MyChart.         Final Clinical Impression(s) / ED Diagnoses Final diagnoses:  Right upper quadrant abdominal pain    Rx / DC Orders ED Discharge Orders     None         Debbra Fairy, PA-C 07/08/23 1604    Rolinda Climes, DO 07/09/23 [phone]

## 2023-07-08 NOTE — Telephone Encounter (Signed)
 Patient stated that he is currently at the hospital and is in severe pain. Patient stated that he has requested a CT scan to be done and would like Dr. Cherryl Corona to call him once those results are back. Please advise.

## 2023-07-08 NOTE — Telephone Encounter (Signed)
 Noted

## 2023-07-08 NOTE — ED Triage Notes (Signed)
 Pt endorses enlarged liver and splenic lesion; states he was told 6 months ago to have follow up ct scan; states she has been out of country and has been unable to do; endorses r side pain under rib cage, worse with movement and positioning; denies fevers, urinary symptoms; endorses some nausea

## 2023-07-08 NOTE — Discharge Instructions (Signed)
 Follow-up with your GI doctor and your primary doctor.  Return if your symptoms worsen.

## 2023-07-08 NOTE — Telephone Encounter (Signed)
 Inbound call from patient, following up on message below. Patient states his pain is severe and he can barely breath, he states he would like an urgent ultrasound ordered for his spleen due to a mass that was found previously. Patient states he has an upcoming colonoscopy in July but feels his symptoms are urgent and cannot wait until then.

## 2023-07-08 NOTE — ED Provider Triage Note (Signed)
 Emergency Medicine Provider Triage Evaluation Note  James Woods , a 58 y.o. male  was evaluated in triage.  Pt complains of upper abd pain.  This told in August 2024 he had an enlarged spleen and need follow-up CT in 3 months but has not been able to get this done, started having pain 3 days ago, PCP was not able to get him in to order the follow-up imaging and advised to come to the hospital.  Review of Systems  Positive: Abdominal pain Negative: Fever, vomiting  Physical Exam  BP 124/89 (BP Location: Right Arm)   Pulse 92   Temp 98.1 F (36.7 C)   Resp (!) 24   SpO2 98%  Gen:   Awake, no distress   Resp:  Normal effort  MSK:   Moves extremities without difficulty  Other:    Medical Decision Making  Medically screening exam initiated at 12:27 PM.  Appropriate orders placed.  James Woods was informed that the remainder of the evaluation will be completed by another provider, this initial triage assessment does not replace that evaluation, and the importance of remaining in the ED until their evaluation is complete.     Aimee Houseman, PA-C 07/08/23 1228

## 2023-07-09 ENCOUNTER — Other Ambulatory Visit: Payer: Self-pay

## 2023-07-09 DIAGNOSIS — R109 Unspecified abdominal pain: Secondary | ICD-10-CM

## 2023-07-09 MED ORDER — PANTOPRAZOLE SODIUM 40 MG PO TBEC: 40.0000 mg | DELAYED_RELEASE_TABLET | Freq: Every day | ORAL | 0 refills | Status: DC

## 2023-07-10 LAB — OPHTHALMOLOGY REPORT-SCANNED

## 2023-07-14 ENCOUNTER — Telehealth: Payer: Self-pay | Admitting: Adult Health

## 2023-07-14 ENCOUNTER — Other Ambulatory Visit: Payer: Self-pay | Admitting: Adult Health

## 2023-07-14 DIAGNOSIS — F331 Major depressive disorder, recurrent, moderate: Secondary | ICD-10-CM

## 2023-07-14 NOTE — Telephone Encounter (Signed)
 Pt called in and left message today at 3:55pm. He wishes to speak with Traci to discuss the paperwork/forms that were completed for him.

## 2023-07-16 NOTE — Telephone Encounter (Signed)
 Noted will contact pt today.

## 2023-07-16 NOTE — Telephone Encounter (Signed)
 Pt called to report his paperwork previously sent to [facility] is being questioned to make sure his condition was not preexisting prior to his time needed off for stabilization. Pt reports they review 3 months back from his initial time out. Informed him he may have had the same diagnosis but due to his exacerbation of his symptoms and needing new medication for stabilization it is new. Pt reports they will be faxing more paperwork over I assume to review his next apt on 6/13, pt wants to make sure documentation refers to this so they will approve things. In addition informed him this is typical when they do reviews for STD and they are not just pinpointing him but we would document his decline in his stability per initial time taken on 06/16/23.

## 2023-07-16 NOTE — Telephone Encounter (Signed)
 LM to call back.

## 2023-07-21 ENCOUNTER — Encounter: Payer: Self-pay | Admitting: Nurse Practitioner

## 2023-07-21 DIAGNOSIS — E1169 Type 2 diabetes mellitus with other specified complication: Secondary | ICD-10-CM

## 2023-07-21 DIAGNOSIS — E782 Mixed hyperlipidemia: Secondary | ICD-10-CM

## 2023-07-21 MED ORDER — ROSUVASTATIN CALCIUM 40 MG PO TABS: 40.0000 mg | ORAL_TABLET | Freq: Every day | ORAL | 0 refills | Status: DC

## 2023-07-21 MED ORDER — TIRZEPATIDE 5 MG/0.5ML ~~LOC~~ SOAJ: 5.0000 mg | SUBCUTANEOUS | 1 refills | Status: DC

## 2023-07-22 ENCOUNTER — Encounter: Payer: Self-pay | Admitting: Gastroenterology

## 2023-07-22 ENCOUNTER — Ambulatory Visit (AMBULATORY_SURGERY_CENTER): Payer: Self-pay | Admitting: *Deleted

## 2023-07-22 VITALS — Ht 68.0 in | Wt 220.0 lb

## 2023-07-22 DIAGNOSIS — D7389 Other diseases of spleen: Secondary | ICD-10-CM

## 2023-07-22 DIAGNOSIS — R109 Unspecified abdominal pain: Secondary | ICD-10-CM

## 2023-07-22 DIAGNOSIS — Z860100 Personal history of colon polyps, unspecified: Secondary | ICD-10-CM

## 2023-07-22 MED ORDER — NA SULFATE-K SULFATE-MG SULF 17.5-3.13-1.6 GM/177ML PO SOLN: 1.0000 | Freq: Once | ORAL | 0 refills | Status: AC

## 2023-07-22 NOTE — Progress Notes (Signed)
 Pre visit completed over telephone. Patient requested ASAP appointment as he is having on-going abdominal pain.  Moved him to day after tomorrow. Patient confirmed he hasn't taken Mounjaro  in over a week. Patient confirmed his diet has not had raw vegetables or salads, nuts or seed the last week   No egg or soy allergy known to patient  No issues known to pt with past sedation with any surgeries or procedures Patient denies ever being told they had issues or difficulty with intubation  No FH of Malignant Hyperthermia Pt is not on diet pills Pt is not on  home 02  Pt is not on blood thinners  Pt denies issues with constipation  No A fib or A flutter Have any cardiac testing pending-- no Pt instructed to use [email or web address] or GoodRx for a price reduction on prep

## 2023-07-23 ENCOUNTER — Telehealth: Payer: Self-pay | Admitting: Gastroenterology

## 2023-07-23 NOTE — Telephone Encounter (Signed)
 Inbound call from patient requesting a call to discuss out of pocket cost for colonoscopy. Please advise, thank you.

## 2023-07-24 ENCOUNTER — Other Ambulatory Visit

## 2023-07-24 ENCOUNTER — Other Ambulatory Visit: Payer: Self-pay

## 2023-07-24 ENCOUNTER — Ambulatory Visit (AMBULATORY_SURGERY_CENTER): Admitting: Gastroenterology

## 2023-07-24 ENCOUNTER — Encounter: Payer: Self-pay | Admitting: Gastroenterology

## 2023-07-24 VITALS — BP 112/60 | HR 79 | Temp 98.0°F | Resp 16 | Ht 68.0 in | Wt 220.0 lb

## 2023-07-24 DIAGNOSIS — Z1211 Encounter for screening for malignant neoplasm of colon: Secondary | ICD-10-CM

## 2023-07-24 DIAGNOSIS — K5730 Diverticulosis of large intestine without perforation or abscess without bleeding: Secondary | ICD-10-CM | POA: Diagnosis not present

## 2023-07-24 DIAGNOSIS — D123 Benign neoplasm of transverse colon: Secondary | ICD-10-CM

## 2023-07-24 DIAGNOSIS — Z860100 Personal history of colon polyps, unspecified: Secondary | ICD-10-CM

## 2023-07-24 DIAGNOSIS — K621 Rectal polyp: Secondary | ICD-10-CM

## 2023-07-24 DIAGNOSIS — D128 Benign neoplasm of rectum: Secondary | ICD-10-CM

## 2023-07-24 MED ORDER — SODIUM CHLORIDE 0.9 % IV SOLN: 500.0000 mL | Freq: Once | INTRAVENOUS | Status: DC

## 2023-07-24 NOTE — Progress Notes (Signed)
 [facility] History and Physical   Primary Care Physician:  Verma Gobble, NP   Reason for Procedure:   History of colon polyps  Plan:    Colonoscopy     HPI: James Woods is a 58 y.o. male undergoing surveillance colonoscopy.  He reports having a colonoscopy in his 15s with several polyps removed (details not available).  He has no family history of colon cancer and no chronic GI symptoms.  He has been having significant abdominal pain recently, which has improved, but not resolved.  He takes Advil daily.   Past Medical History:  Diagnosis Date   Anxiety    Diabetes (HCC)    High cholesterol    Low testosterone  in male    Overweight     Past Surgical History:  Procedure Laterality Date   COLONOSCOPY  2018   IRIDOTOMY / IRIDECTOMY Bilateral 11/11/2020   Dr.Groat   None      Prior to Admission medications   Medication Sig Start Date End Date Taking? Authorizing Provider  buPROPion  (WELLBUTRIN  XL) 150 MG 24 hr tablet Take 1 tablet (150 mg total) by mouth daily. 06/16/23  Yes Mozingo, Regina Nattalie, NP  diazepam  (VALIUM ) 5 MG tablet Take 1 tablet (5 mg total) by mouth 2 (two) times daily. 06/16/23  Yes Mozingo, Regina Nattalie, NP  diphenhydrAMINE HCl, Sleep, (SLEEP AID) 50 MG CAPS Take 1 capsule by mouth at bedtime.   Yes [provider]  ibuprofen (ADVIL) 200 MG tablet Take 200 mg by mouth as needed.   Yes [provider]  rosuvastatin  (CRESTOR ) 40 MG tablet Take 1 tablet (40 mg total) by mouth daily. 07/21/23  Yes Verma Gobble, NP  sildenafil  (VIAGRA ) 100 MG tablet TAKE 1 TABLET BY MOUTH AS NEEDED. FOR ERECTILE DYSFUNCTION. 05/24/22  Yes Eubanks, Jessica K, NP  testosterone  cypionate (DEPOTESTOSTERONE CYPIONATE) 200 MG/ML injection SMARTSIG:0.4 Milliliter(s) IM Twice a Week 06/19/23  Yes [provider]  tirzepatide  (MOUNJARO ) 5 MG/0.5ML Pen Inject 5 mg into the skin once a week. 07/21/23   Verma Gobble, NP    Current  Outpatient Medications  Medication Sig Dispense Refill   buPROPion  (WELLBUTRIN  XL) 150 MG 24 hr tablet Take 1 tablet (150 mg total) by mouth daily. 30 tablet 2   diazepam  (VALIUM ) 5 MG tablet Take 1 tablet (5 mg total) by mouth 2 (two) times daily. 60 tablet 2   diphenhydrAMINE HCl, Sleep, (SLEEP AID) 50 MG CAPS Take 1 capsule by mouth at bedtime.     ibuprofen (ADVIL) 200 MG tablet Take 200 mg by mouth as needed.     rosuvastatin  (CRESTOR ) 40 MG tablet Take 1 tablet (40 mg total) by mouth daily. 90 tablet 0   sildenafil  (VIAGRA ) 100 MG tablet TAKE 1 TABLET BY MOUTH AS NEEDED. FOR ERECTILE DYSFUNCTION. 6 tablet 11   testosterone  cypionate (DEPOTESTOSTERONE CYPIONATE) 200 MG/ML injection SMARTSIG:0.4 Milliliter(s) IM Twice a Week     tirzepatide  (MOUNJARO ) 5 MG/0.5ML Pen Inject 5 mg into the skin once a week. 6 mL 1   Current Facility-Administered Medications  Medication Dose Route Frequency Provider Last Rate Last Admin   0.9 %  sodium chloride infusion  500 mL Intravenous Once Elois Hair, MD        Allergies as of 07/24/2023 - Review Complete 07/24/2023  Allergen Reaction Noted   Penicillins Rash 06/18/2019    Family History  Problem Relation Age of Onset   Stroke Mother    Diabetes Mother  Stroke Father    Esophageal cancer Neg Hx    Stomach cancer Neg Hx    Colon cancer Neg Hx    Rectal cancer Neg Hx     Social History   Socioeconomic History   Marital status: Single    Spouse name: Not on file   Number of children: 2   Years of education: Not on file   Highest education level: Associate degree: academic program  Occupational History   Occupation: retail  Tobacco Use   Smoking status: Every Day    Types: E-cigarettes   Smokeless tobacco: Never   Tobacco comments:    Former smoker, currently vapes daily  Vaping Use   Vaping status: Some Days   Start date: 07/12/2020   Substances: Nicotine, Flavoring  Substance and Sexual Activity   Alcohol use: Yes     Alcohol/week: 7.0 standard drinks of alcohol    Types: 7 Cans of beer per week    Comment: occassionally   Drug use: Never   Sexual activity: Not on file  Other Topics Concern   Not on file  Social History Narrative   Tobacco use:  1/2 pack per day, 30 years   Alcohol use: None   Diet:     Do you drink/eat things with caffeine?  No.   Marital status: Married in 1993.    Do you live in a house, apartment, assisted living, condo, trailer, etc.)? House   Is it one or more stories? One story.   How many persons live in your home?  2   Do you have any pets in your home? (please list)  3 dogs   Highest level of education: - Some college.   Current or past profession:  Retail   Do you exercise:  No.       Advanced Directive   No Living Will, no DNR, no POA/HPOA      FUNCTIONAL STATUS   No difficulty with bathing, dressing, preparing food, eating, managing medications, finances. No difficulty with affording medications.    Social Drivers of Corporate investment banker Strain: Low Risk  (06/09/2023)   Overall Financial Resource Strain (CARDIA)    Difficulty of Paying Living Expenses: Not very hard  Food Insecurity: No Food Insecurity (06/09/2023)   Hunger Vital Sign    Worried About Running Out of Food in the Last Year: Never true    Ran Out of Food in the Last Year: Never true  Transportation Needs: No Transportation Needs (06/09/2023)   PRAPARE - Administrator, Civil Service (Medical): No    Lack of Transportation (Non-Medical): No  Physical Activity: Insufficiently Active (06/09/2023)   Exercise Vital Sign    Days of Exercise per Week: 2 days    Minutes of Exercise per Session: 30 min  Stress: No Stress Concern Present (06/09/2023)   [employer] of Occupational Health - Occupational Stress Questionnaire    Feeling of Stress : Not at all  Social Connections: Moderately Isolated (06/09/2023)   Social Connection and Isolation Panel    Frequency of Communication  with Friends and Family: Once a week    Frequency of Social Gatherings with Friends and Family: Never    Attends Religious Services: 1 to 4 times per year    Active Member of [employer] or Organizations: No    Attends Engineer, structural: Not on file    Marital Status: Married  Catering manager Violence: Not on file    Review of  Systems:  All other review of systems negative except as mentioned in the HPI.  Physical Exam: Vital signs BP 130/85   Pulse 81   Temp 98 F (36.7 C) (Temporal)   Ht 5' 8 (1.727 m)   Wt 220 lb (99.8 kg)   SpO2 97%   BMI 33.45 kg/m   General:   Alert,  Well-developed, well-nourished, pleasant and cooperative in NAD Airway:  Mallampati 2 Lungs:  Clear throughout to auscultation.   Heart:  Regular rate and rhythm; no murmurs, clicks, rubs,  or gallops. Abdomen:  Soft, nontender and nondistended. Normal bowel sounds.   Neuro/Psych:  Normal mood and affect. A and O x 3   Ollin Hochmuth E. Cherryl Corona, MD [facility]

## 2023-07-24 NOTE — Progress Notes (Signed)
 Report to PACU, RN, vss, BBS= Clear.

## 2023-07-24 NOTE — Op Note (Signed)
 [facility] Patient Name: James Woods Procedure Date: 07/24/2023 1:19 PM MRN: [id number] Endoscopist: Geralyn Knee E. Cherryl Corona , MD, [id number] Age: 58 Referring MD:  Date of Birth: 01-Apr-1965 Gender: Male Account #: [id number] Procedure:                Colonoscopy Indications:              Surveillance: Personal history of colonic polyps                            (unknown histology) on last colonoscopy more than 5                            years ago Medicines:                Monitored Anesthesia Care Procedure:                Pre-Anesthesia Assessment:                           - Prior to the procedure, a History and Physical                            was performed, and patient medications and                            allergies were reviewed. The patient's tolerance of                            previous anesthesia was also reviewed. The risks                            and benefits of the procedure and the sedation                            options and risks were discussed with the patient.                            All questions were answered, and informed consent                            was obtained. Prior Anticoagulants: The patient has                            taken no anticoagulant or antiplatelet agents. ASA                            Grade Assessment: II - A patient with mild systemic                            disease. After reviewing the risks and benefits,                            the patient was deemed in satisfactory condition to  undergo the procedure.                           After obtaining informed consent, the colonoscope                            was passed under direct vision. Throughout the                            procedure, the patient's blood pressure, pulse, and                            oxygen saturations were monitored continuously. The                            CF [id number] #[id number] was introduced through  the anus                            and advanced to the the terminal ileum, with                            identification of the appendiceal orifice and IC                            valve. The colonoscopy was performed without                            difficulty. The patient tolerated the procedure                            well. The quality of the bowel preparation was                            adequate. The terminal ileum, ileocecal valve,                            appendiceal orifice, and rectum were photographed.                            The bowel preparation used was SUPREP via split                            dose instruction. Scope In: 1:28:31 PM Scope Out: 1:46:34 PM Scope Withdrawal Time: 0 hours 15 minutes 44 seconds  Total Procedure Duration: 0 hours 18 minutes 3 seconds  Findings:                 The perianal and digital rectal examinations were                            normal. Pertinent negatives include normal                            sphincter tone and no palpable rectal lesions.  A 3 mm polyp was found in the transverse colon. The                            polyp was sessile. The polyp was removed with a                            cold snare. Resection and retrieval were complete.                            Estimated blood loss was minimal.                           Two sessile polyps were found in the proximal                            rectum. The polyps were 3 mm in size. These polyps                            were removed with a cold snare. Resection and                            retrieval were complete. Estimated blood loss was                            minimal.                           A few small-mouthed diverticula were found in the                            sigmoid colon and descending colon.                           The exam was otherwise normal throughout the                            examined colon.                            The terminal ileum appeared normal.                           The retroflexed view of the distal rectum and anal                            verge was normal and showed no anal or rectal                            abnormalities. Complications:            No immediate complications. Estimated Blood Loss:     Estimated blood loss was minimal. Impression:               - One 3 mm polyp in the transverse colon, removed  with a cold snare. Resected and retrieved.                           - Two 3 mm polyps in the proximal rectum, removed                            with a cold snare. Resected and retrieved.                           - Mild diverticulosis in the sigmoid colon and in                            the descending colon.                           - The examined portion of the ileum was normal.                           - The distal rectum and anal verge are normal on                            retroflexion view. Recommendation:           - Patient has a contact number available for                            emergencies. The signs and symptoms of potential                            delayed complications were discussed with the                            patient. Return to normal activities tomorrow.                            Written discharge instructions were provided to the                            patient.                           - Resume previous diet.                           - Continue present medications.                           - Await pathology results.                           - Repeat colonoscopy (date not yet determined) for                            surveillance based on pathology results. Catcher Dehoyos E. Cherryl Corona, MD 07/24/2023 1:52:33 PM This report has been signed electronically.

## 2023-07-24 NOTE — Patient Instructions (Signed)

## 2023-07-24 NOTE — Progress Notes (Signed)
 Pt's states no medical or surgical changes since previsit or office visit.

## 2023-07-24 NOTE — Progress Notes (Signed)
 Called to room to assist during endoscopic procedure.  Patient ID and intended procedure confirmed with present staff. Received instructions for my participation in the procedure from the performing physician.

## 2023-07-25 ENCOUNTER — Telehealth: Admitting: Adult Health

## 2023-07-25 ENCOUNTER — Encounter: Payer: Self-pay | Admitting: Adult Health

## 2023-07-25 ENCOUNTER — Telehealth: Payer: Self-pay | Admitting: *Deleted

## 2023-07-25 DIAGNOSIS — F411 Generalized anxiety disorder: Secondary | ICD-10-CM | POA: Diagnosis not present

## 2023-07-25 DIAGNOSIS — G4700 Insomnia, unspecified: Secondary | ICD-10-CM | POA: Diagnosis not present

## 2023-07-25 DIAGNOSIS — F410 Panic disorder [episodic paroxysmal anxiety] without agoraphobia: Secondary | ICD-10-CM

## 2023-07-25 DIAGNOSIS — F331 Major depressive disorder, recurrent, moderate: Secondary | ICD-10-CM | POA: Diagnosis not present

## 2023-07-25 MED ORDER — CARIPRAZINE HCL 1.5 MG PO CAPS: 1.5000 mg | ORAL_CAPSULE | Freq: Every day | ORAL | 2 refills | Status: DC

## 2023-07-25 NOTE — Telephone Encounter (Signed)
No answer for post procedure follow up. Left VM. °

## 2023-07-25 NOTE — Progress Notes (Addendum)
 James Woods [id number] 1965/07/10 58 y.o.  Virtual Visit via Video Note  I connected with pt @ on 07/25/23 at  3:00 PM EDT by a video enabled telemedicine application and verified that I am speaking with the correct person using two identifiers.   I discussed the limitations of evaluation and management by telemedicine and the availability of in person appointments. The patient expressed understanding and agreed to proceed.  I discussed the assessment and treatment plan with the patient. The patient was provided an opportunity to ask questions and all were answered. The patient agreed with the plan and demonstrated an understanding of the instructions.   The patient was advised to call back or seek an in-person evaluation if the symptoms worsen or if the condition fails to improve as anticipated.  I provided 25 minutes of non-face-to-face time during this encounter.  The patient was located at home.  The provider was located at [facility].   Reagan Camera, NP   Subjective:   Patient ID:  James Woods is a 58 y.o. (DOB Jan 04, 1966) male.  Chief Complaint: No chief complaint on file.   HPI Tremain Sava presents for follow-up of MDD, panic attacks, insomnia and GAD.   Describes mood today as not any better. Pleasant. Flat. Reports tearfulness - emotional. Mood symptoms - reports depression and anxiety - feels disengaged and unsocial. Reports increased irritability. Reports low interest and motivation. Reports panic attacks - they come in waves. Reports feeling overwhelmed - maybe a little less with being away from work. Reports difficulties making decisions. Reports aches and pains - headaches. Reports throat clamping up and chest tightness. Reports ongoing difficulties with critical thinking and decision making. Reports worry, rumination and over thinking. Denies obsessive thoughts. Denies obsessive acts. Reports his mood has not improved. Reports he is  worried about losing his job, but is unable to perform job functions with current mood symptoms. Reports starting the Wellbutrin  but does not feel like it has been helpful. Reports Valium  is helpful for anxiety, but short lived. He is willing to consider other options for mood stabilization.  Energy levels lower. Active, does not have a regular exercise routine. Unable to enjoy usual interests and activities. Lives with wife. Has 2 grown children. Spending time with family. Appetite decreased. Reports weight loss. Taking Monjauro. Reports sleeping difficulties. Averages 3.5 hours at night. Denies daytime napping.  Reports difficulties with focus and concentration - feels like he is all over and scattered. Completing minimal tasks around the house. Out of work currently. Reports passive thoughts once in a while without a plan.  Denies HI.  Denies AH or VH. Denies substance use. Denies self harm. Reports alcohol consumption - 1 beer daily.  Previous medication trials: Wellbutrin , Celexa, Lexapro , Prozac, Lexapro , Clonazepam, Abilify  Review of Systems:  Review of Systems  Musculoskeletal:  Negative for gait problem.  Neurological:  Negative for tremors.  Psychiatric/Behavioral:         Please refer to HPI   Medications: I have reviewed the patient's current medications.  Current Outpatient Medications  Medication Sig Dispense Refill   cariprazine (VRAYLAR) 1.5 MG capsule Take 1 capsule (1.5 mg total) by mouth daily. 30 capsule 2   buPROPion  (WELLBUTRIN  XL) 150 MG 24 hr tablet Take 1 tablet (150 mg total) by mouth daily. 30 tablet 2   diazepam  (VALIUM ) 5 MG tablet Take 1 tablet (5 mg total) by mouth 2 (two) times daily. 60 tablet 2   diphenhydrAMINE HCl, Sleep, (SLEEP AID) 50  MG CAPS Take 1 capsule by mouth at bedtime.     ibuprofen (ADVIL) 200 MG tablet Take 200 mg by mouth as needed.     rosuvastatin  (CRESTOR ) 40 MG tablet Take 1 tablet (40 mg total) by mouth daily. 90 tablet 0    sildenafil  (VIAGRA ) 100 MG tablet TAKE 1 TABLET BY MOUTH AS NEEDED. FOR ERECTILE DYSFUNCTION. 6 tablet 11   testosterone  cypionate (DEPOTESTOSTERONE CYPIONATE) 200 MG/ML injection SMARTSIG:0.4 Milliliter(s) IM Twice a Week     tirzepatide  (MOUNJARO ) 5 MG/0.5ML Pen Inject 5 mg into the skin once a week. 6 mL 1   No current facility-administered medications for this visit.    Medication Side Effects: None  Allergies:  Allergies  Allergen Reactions   Penicillins Rash    Past Medical History:  Diagnosis Date   Anxiety    Diabetes (HCC)    High cholesterol    Low testosterone  in male    Overweight     Family History  Problem Relation Age of Onset   Stroke Mother    Diabetes Mother    Stroke Father    Esophageal cancer Neg Hx    Stomach cancer Neg Hx    Colon cancer Neg Hx    Rectal cancer Neg Hx     Social History   Socioeconomic History   Marital status: Single    Spouse name: Not on file   Number of children: 2   Years of education: Not on file   Highest education level: Associate degree: academic program  Occupational History   Occupation: retail  Tobacco Use   Smoking status: Every Day    Types: E-cigarettes   Smokeless tobacco: Never   Tobacco comments:    Former smoker, currently vapes daily  Vaping Use   Vaping status: Some Days   Start date: 07/12/2020   Substances: Nicotine, Flavoring  Substance and Sexual Activity   Alcohol use: Yes    Alcohol/week: 7.0 standard drinks of alcohol    Types: 7 Cans of beer per week    Comment: occassionally   Drug use: Never   Sexual activity: Not on file  Other Topics Concern   Not on file  Social History Narrative   Tobacco use:  1/2 pack per day, 30 years   Alcohol use: None   Diet:     Do you drink/eat things with caffeine?  No.   Marital status: Married in 1993.    Do you live in a house, apartment, assisted living, condo, trailer, etc.)? House   Is it one or more stories? One story.   How many persons  live in your home?  2   Do you have any pets in your home? (please list)  3 dogs   Highest level of education: - Some college.   Current or past profession:  Retail   Do you exercise:  No.       Advanced Directive   No Living Will, no DNR, no POA/HPOA      FUNCTIONAL STATUS   No difficulty with bathing, dressing, preparing food, eating, managing medications, finances. No difficulty with affording medications.    Social Drivers of Corporate investment banker Strain: Low Risk  (06/09/2023)   Overall Financial Resource Strain (CARDIA)    Difficulty of Paying Living Expenses: Not very hard  Food Insecurity: No Food Insecurity (06/09/2023)   Hunger Vital Sign    Worried About Running Out of Food in the Last Year: Never true  Ran Out of Food in the Last Year: Never true  Transportation Needs: No Transportation Needs (06/09/2023)   PRAPARE - Administrator, Civil Service (Medical): No    Lack of Transportation (Non-Medical): No  Physical Activity: Insufficiently Active (06/09/2023)   Exercise Vital Sign    Days of Exercise per Week: 2 days    Minutes of Exercise per Session: 30 min  Stress: No Stress Concern Present (06/09/2023)   [employer] of Occupational Health - Occupational Stress Questionnaire    Feeling of Stress : Not at all  Social Connections: Moderately Isolated (06/09/2023)   Social Connection and Isolation Panel    Frequency of Communication with Friends and Family: Once a week    Frequency of Social Gatherings with Friends and Family: Never    Attends Religious Services: 1 to 4 times per year    Active Member of [employer] or Organizations: No    Attends Engineer, structural: Not on file    Marital Status: Married  Catering manager Violence: Not on file    Past Medical History, Surgical history, Social history, and Family history were reviewed and updated as appropriate.   Please see review of systems for further details on the patient's review  from today.   Objective:   Physical Exam:  There were no vitals taken for this visit.  Physical Exam Constitutional:      General: He is not in acute distress.  Musculoskeletal:        General: No deformity.   Neurological:     Mental Status: He is alert and oriented to person, place, and time.     Coordination: Coordination normal.   Psychiatric:        Attention and Perception: Attention and perception normal. He does not perceive auditory or visual hallucinations.        Mood and Affect: Mood is anxious and depressed. Affect is blunt and flat. Affect is not labile, angry or inappropriate.        Speech: Speech normal.        Behavior: Behavior normal.        Thought Content: Thought content normal. Thought content is not paranoid or delusional. Thought content does not include homicidal or suicidal ideation. Thought content does not include homicidal or suicidal plan.        Cognition and Memory: Cognition and memory normal.        Judgment: Judgment normal.     Comments: Insight intact    Lab Review:     Component Value Date/Time   NA 137 07/08/2023 1233   K 4.4 07/08/2023 1233   CL 102 07/08/2023 1233   CO2 24 07/08/2023 1233   GLUCOSE 109 (H) 07/08/2023 1233   BUN 11 07/08/2023 1233   CREATININE 0.95 07/08/2023 1233   CREATININE 0.78 06/06/2023 0948   CALCIUM  9.0 07/08/2023 1233   PROT 7.7 07/08/2023 1233   ALBUMIN 3.7 07/08/2023 1233   AST 47 (H) 07/08/2023 1233   ALT 63 (H) 07/08/2023 1233   ALKPHOS 65 07/08/2023 1233   BILITOT 1.1 07/08/2023 1233   GFRNONAA >60 07/08/2023 1233   GFRNONAA 84 06/01/2020 0813   GFRAA 97 06/01/2020 0813       Component Value Date/Time   WBC 6.0 07/08/2023 1233   RBC 5.69 07/08/2023 1233   HGB 15.8 07/08/2023 1233   HCT 47.5 07/08/2023 1233   PLT 118 (L) 07/08/2023 1233   MCV 83.5 07/08/2023 1233   MCH 27.8  07/08/2023 1233   MCHC 33.3 07/08/2023 1233   RDW 15.6 (H) 07/08/2023 1233   LYMPHSABS 1.3 07/08/2023 1233    MONOABS 0.5 07/08/2023 1233   EOSABS 0.3 07/08/2023 1233   BASOSABS 0.0 07/08/2023 1233    No results found for: POCLITH, LITHIUM   No results found for: PHENYTOIN, PHENOBARB, VALPROATE, CBMZ   .res Assessment: Plan:    Plan:  PDMP reviewed  D/C Wellbutrin  XL 150mg  daily - has taken previously - denies seizure history.  Add Vraylar 1.5mg  daily for mood symptoms  Valium  5mg  twice daily   Will take Mr. Paige out of work 07/28/2023 though 09/08/2023 for medication management and mood stabilization. He is totally disabled and unable to work at this time.  RTC 6 weeks  40 minutes spent dedicated to the care of this patient on the date of this encounter to include pre-visit review of records, ordering of medication, post visit documentation, and face-to-face time with the patient discussing anxiety. Discussed continuing current medication regimen.  Patient advised to contact office with any questions, adverse effects, or acute worsening in signs and symptoms.  Discussed potential benefits, risk, and side effects of benzodiazepines to include potential risk of tolerance and dependence, as well as possible drowsiness.  Advised patient not to drive if experiencing drowsiness and to take lowest possible effective dose to minimize risk of dependence and tolerance.  Diagnoses and all orders for this visit:  Major depressive disorder, recurrent episode, moderate (HCC) -     cariprazine (VRAYLAR) 1.5 MG capsule; Take 1 capsule (1.5 mg total) by mouth daily.  Generalized anxiety disorder  Panic attacks  Insomnia, unspecified type     Please see After Visit Summary for patient specific instructions.  No future appointments.   No orders of the defined types were placed in this encounter.     -------------------------------

## 2023-07-28 ENCOUNTER — Telehealth: Payer: Self-pay | Admitting: Adult Health

## 2023-07-28 NOTE — Telephone Encounter (Signed)
 I was told additional paperwork was not being faxed. The office note is suppose to go which I can not do, Jerlene Moody is responsible for that, will get with her on where it is to be sent.

## 2023-07-28 NOTE — Telephone Encounter (Addendum)
 Pt called reporting RM apt 6/13 has taken him out of work until 09/08/23 (6 weeks) Requesting letter stating under RM care and dates faxed to pt @ [phone]. For employer. Also, asking Traci if received forms from employer mailed 07/14/23 for STD to complete and return asap.Pt contact [phone]

## 2023-07-29 LAB — SURGICAL PATHOLOGY

## 2023-08-03 ENCOUNTER — Ambulatory Visit: Payer: Self-pay | Admitting: Gastroenterology

## 2023-08-03 NOTE — Progress Notes (Signed)
 James Woods,  One polyp which I removed during your recent procedure was proven to be completely benign but is considered a pre-cancerous polyp that MAY have grown into cancer if it had not been removed.  The other two polyps were not precancerous.  Studies shows that at least 20% of women over age 57 and 30% of men over age 23 have pre-cancerous polyps.  Based on current nationally recognized surveillance guidelines, I recommend that you have a repeat colonoscopy in 7 years.   If you develop any new rectal bleeding, abdominal pain or significant bowel habit changes, please contact me before then.

## 2023-08-11 ENCOUNTER — Encounter: Admitting: Gastroenterology

## 2023-08-11 ENCOUNTER — Telehealth: Payer: Self-pay | Admitting: Adult Health

## 2023-08-11 NOTE — Telephone Encounter (Signed)
 Pt called to schedule apt asap. 7/9 & canc list. Stated he will fax over a denial letter he received for Traci to review and change paperwork as needed. Ask for a return call before you get letter @ [phone]

## 2023-08-11 NOTE — Telephone Encounter (Signed)
 His STD claim states his diagnosis is a pre-existing condition so not sure how anything can be changed. They require an appeal if supported.   His policy reads, he became insured under his policy on 05/13/23 and since he stopped working during the first 12 months after that date they investigated his condition and it is noted he received treatment 3 months prior to his effective date. Which they classify as pre-existing 02/12/23-05/13/23.

## 2023-08-14 ENCOUNTER — Encounter: Payer: Self-pay | Admitting: Gastroenterology

## 2023-08-14 ENCOUNTER — Telehealth: Payer: Self-pay | Admitting: Adult Health

## 2023-08-14 NOTE — Telephone Encounter (Signed)
 Pt called at 9:20a asking for you to call him back.

## 2023-08-20 ENCOUNTER — Telehealth: Admitting: Adult Health

## 2023-08-20 ENCOUNTER — Encounter: Payer: Self-pay | Admitting: Adult Health

## 2023-08-20 DIAGNOSIS — F331 Major depressive disorder, recurrent, moderate: Secondary | ICD-10-CM

## 2023-08-20 DIAGNOSIS — F410 Panic disorder [episodic paroxysmal anxiety] without agoraphobia: Secondary | ICD-10-CM

## 2023-08-20 DIAGNOSIS — G4700 Insomnia, unspecified: Secondary | ICD-10-CM | POA: Diagnosis not present

## 2023-08-20 DIAGNOSIS — F411 Generalized anxiety disorder: Secondary | ICD-10-CM | POA: Diagnosis not present

## 2023-08-20 NOTE — Progress Notes (Signed)
 James Woods [id number] 08-Jan-1966 58 y.o.  Virtual Visit via Video Note  I connected with pt @ on 08/20/23 at  2:30 PM EDT by a video enabled telemedicine application and verified that I am speaking with the correct person using two identifiers.   I discussed the limitations of evaluation and management by telemedicine and the availability of in person appointments. The patient expressed understanding and agreed to proceed.  I discussed the assessment and treatment plan with the patient. The patient was provided an opportunity to ask questions and all were answered. The patient agreed with the plan and demonstrated an understanding of the instructions.   The patient was advised to call back or seek an in-person evaluation if the symptoms worsen or if the condition fails to improve as anticipated.  I provided 40 minutes of non-face-to-face time during this encounter.  The patient was located at home.  The provider was located at [facility].   Angeline LOISE Sayers, NP   Subjective:   Patient ID:  James Woods is a 58 y.o. (DOB 04-May-1965) male.  Chief Complaint: No chief complaint on file.   HPI James Woods presents for follow-up of MDD, panic attacks, insomnia and GAD.   Describes mood today as better. Pleasant. Denies tearfulness. Mood symptoms - denies depression, anxiety and irritability. Reports stable interest and motivation. Denies panic attacks. Denies feeling overwhelmed. Reports feeling confident about making decisions. Denies worry, rumination and over thinking. Denies obsessive thoughts. Denies obsessive acts. Reports mood as stable. Stating I feel like I'm doing better. Continues to take the Valium  as needed and the Vraylar  daily. Energy levels improved. Active, does not have a regular exercise routine - has started walking.  Reports he is able to enjoy usual interests and activities. Lives with wife. Has 2 grown children. Spending time with family. Appetite  improved. Reports weight loss. Taking Monjauro. Reports sleep has improved. Averages 6 hours at night. Denies daytime napping.  Reports focus and concentration has improved. Completing tasks around the house. Out of work currently but would like to return. Denies SI.   Denies HI.  Denies AH or VH. Denies substance use. Denies self harm. Reports alcohol consumption - 1 beer daily.  Previous medication trials: Wellbutrin , Celexa, Lexapro , Prozac, Lexapro , Clonazepam, Abilify  Review of Systems:  Review of Systems  Musculoskeletal:  Negative for gait problem.  Neurological:  Negative for tremors.  Psychiatric/Behavioral:         Please refer to HPI    Medications: I have reviewed the patient's current medications.  Current Outpatient Medications  Medication Sig Dispense Refill   buPROPion  (WELLBUTRIN  XL) 150 MG 24 hr tablet Take 1 tablet (150 mg total) by mouth daily. 30 tablet 2   cariprazine  (VRAYLAR ) 1.5 MG capsule Take 1 capsule (1.5 mg total) by mouth daily. 30 capsule 2   diazepam  (VALIUM ) 5 MG tablet Take 1 tablet (5 mg total) by mouth 2 (two) times daily. 60 tablet 2   diphenhydrAMINE HCl, Sleep, (SLEEP AID) 50 MG CAPS Take 1 capsule by mouth at bedtime.     ibuprofen (ADVIL) 200 MG tablet Take 200 mg by mouth as needed.     rosuvastatin  (CRESTOR ) 40 MG tablet Take 1 tablet (40 mg total) by mouth daily. 90 tablet 0   sildenafil  (VIAGRA ) 100 MG tablet TAKE 1 TABLET BY MOUTH AS NEEDED. FOR ERECTILE DYSFUNCTION. 6 tablet 11   testosterone  cypionate (DEPOTESTOSTERONE CYPIONATE) 200 MG/ML injection SMARTSIG:0.4 Milliliter(s) IM Twice a Week  tirzepatide  (MOUNJARO ) 5 MG/0.5ML Pen Inject 5 mg into the skin once a week. 6 mL 1   No current facility-administered medications for this visit.    Medication Side Effects: None  Allergies:  Allergies  Allergen Reactions   Penicillins Rash    Past Medical History:  Diagnosis Date   Anxiety    Diabetes (HCC)    High  cholesterol    Low testosterone  in male    Overweight     Family History  Problem Relation Age of Onset   Stroke Mother    Diabetes Mother    Stroke Father    Esophageal cancer Neg Hx    Stomach cancer Neg Hx    Colon cancer Neg Hx    Rectal cancer Neg Hx     Social History   Socioeconomic History   Marital status: Single    Spouse name: Not on file   Number of children: 2   Years of education: Not on file   Highest education level: Associate degree: academic program  Occupational History   Occupation: retail  Tobacco Use   Smoking status: Every Day    Types: E-cigarettes   Smokeless tobacco: Never   Tobacco comments:    Former smoker, currently vapes daily  Vaping Use   Vaping status: Some Days   Start date: 07/12/2020   Substances: Nicotine, Flavoring  Substance and Sexual Activity   Alcohol use: Yes    Alcohol/week: 7.0 standard drinks of alcohol    Types: 7 Cans of beer per week    Comment: occassionally   Drug use: Never   Sexual activity: Not on file  Other Topics Concern   Not on file  Social History Narrative   Tobacco use:  1/2 pack per day, 30 years   Alcohol use: None   Diet:     Do you drink/eat things with caffeine?  No.   Marital status: Married in 1993.    Do you live in a house, apartment, assisted living, condo, trailer, etc.)? House   Is it one or more stories? One story.   How many persons live in your home?  2   Do you have any pets in your home? (please list)  3 dogs   Highest level of education: - Some college.   Current or past profession:  Retail   Do you exercise:  No.       Advanced Directive   No Living Will, no DNR, no POA/HPOA      FUNCTIONAL STATUS   No difficulty with bathing, dressing, preparing food, eating, managing medications, finances. No difficulty with affording medications.    Social Drivers of Corporate investment banker Strain: Low Risk  (06/09/2023)   Overall Financial Resource Strain (CARDIA)    Difficulty  of Paying Living Expenses: Not very hard  Food Insecurity: No Food Insecurity (06/09/2023)   Hunger Vital Sign    Worried About Running Out of Food in the Last Year: Never true    Ran Out of Food in the Last Year: Never true  Transportation Needs: No Transportation Needs (06/09/2023)   PRAPARE - Administrator, Civil Service (Medical): No    Lack of Transportation (Non-Medical): No  Physical Activity: Insufficiently Active (06/09/2023)   Exercise Vital Sign    Days of Exercise per Week: 2 days    Minutes of Exercise per Session: 30 min  Stress: No Stress Concern Present (06/09/2023)   [employer] of Occupational Health - Occupational  Stress Questionnaire    Feeling of Stress : Not at all  Social Connections: Moderately Isolated (06/09/2023)   Social Connection and Isolation Panel    Frequency of Communication with Friends and Family: Once a week    Frequency of Social Gatherings with Friends and Family: Never    Attends Religious Services: 1 to 4 times per year    Active Member of [employer] or Organizations: No    Attends Engineer, structural: Not on file    Marital Status: Married  Catering manager Violence: Not on file    Past Medical History, Surgical history, Social history, and Family history were reviewed and updated as appropriate.   Please see review of systems for further details on the patient's review from today.   Objective:   Physical Exam:  There were no vitals taken for this visit.  Physical Exam Constitutional:      General: He is not in acute distress. Musculoskeletal:        General: No deformity.  Neurological:     Mental Status: He is alert and oriented to person, place, and time.     Coordination: Coordination normal.  Psychiatric:        Attention and Perception: Attention and perception normal. He does not perceive auditory or visual hallucinations.        Mood and Affect: Mood normal. Mood is not anxious or depressed. Affect is  not labile, blunt, angry or inappropriate.        Speech: Speech normal.        Behavior: Behavior normal.        Thought Content: Thought content normal. Thought content is not paranoid or delusional. Thought content does not include homicidal or suicidal ideation. Thought content does not include homicidal or suicidal plan.        Cognition and Memory: Cognition and memory normal.        Judgment: Judgment normal.     Comments: Insight intact     Lab Review:     Component Value Date/Time   NA 137 07/08/2023 1233   K 4.4 07/08/2023 1233   CL 102 07/08/2023 1233   CO2 24 07/08/2023 1233   GLUCOSE 109 (H) 07/08/2023 1233   BUN 11 07/08/2023 1233   CREATININE 0.95 07/08/2023 1233   CREATININE 0.78 06/06/2023 0948   CALCIUM  9.0 07/08/2023 1233   PROT 7.7 07/08/2023 1233   ALBUMIN 3.7 07/08/2023 1233   AST 47 (H) 07/08/2023 1233   ALT 63 (H) 07/08/2023 1233   ALKPHOS 65 07/08/2023 1233   BILITOT 1.1 07/08/2023 1233   GFRNONAA >60 07/08/2023 1233   GFRNONAA 84 06/01/2020 0813   GFRAA 97 06/01/2020 0813       Component Value Date/Time   WBC 6.0 07/08/2023 1233   RBC 5.69 07/08/2023 1233   HGB 15.8 07/08/2023 1233   HCT 47.5 07/08/2023 1233   PLT 118 (L) 07/08/2023 1233   MCV 83.5 07/08/2023 1233   MCH 27.8 07/08/2023 1233   MCHC 33.3 07/08/2023 1233   RDW 15.6 (H) 07/08/2023 1233   LYMPHSABS 1.3 07/08/2023 1233   MONOABS 0.5 07/08/2023 1233   EOSABS 0.3 07/08/2023 1233   BASOSABS 0.0 07/08/2023 1233    No results found for: POCLITH, LITHIUM   No results found for: PHENYTOIN, PHENOBARB, VALPROATE, CBMZ   .res Assessment: Plan:   Plan:  PDMP reviewed  Continue: Vraylar  1.5mg  daily for mood symptoms Valium  5mg  twice daily   James Woods was seen for  a scheduled appointment today. He reports his mood has improved and he would like to return to work. He may return to work on 08/25/2023 without restrictions.  RTC 6 weeks  40 minutes spent dedicated to  the care of this patient on the date of this encounter to include pre-visit review of records, ordering of medication, post visit documentation, and face-to-face time with the patient discussing anxiety. Discussed continuing current medication regimen.  Patient advised to contact office with any questions, adverse effects, or acute worsening in signs and symptoms.  Discussed potential benefits, risk, and side effects of benzodiazepines to include potential risk of tolerance and dependence, as well as possible drowsiness.  Advised patient not to drive if experiencing drowsiness and to take lowest possible effective dose to minimize risk of dependence and tolerance.  There are no diagnoses linked to this encounter.   Please see After Visit Summary for patient specific instructions.  Future Appointments  Date Time Provider Department Center  08/20/2023  2:30 PM Cniyah Sproull Nattalie, NP [facility] None    No orders of the defined types were placed in this encounter.     -------------------------------

## 2023-08-25 ENCOUNTER — Telehealth: Payer: Self-pay | Admitting: Adult Health

## 2023-08-25 NOTE — Telephone Encounter (Signed)
 Called pt per request of provider. Had to LM. If patient believes info in his record is  incorrect he will need to do an Amendment request thru [facility] HIM Dept. Otherwise provider's record is reviewed with patient each visit.

## 2023-08-25 NOTE — Telephone Encounter (Signed)
Please review pt request

## 2023-08-25 NOTE — Telephone Encounter (Signed)
 James Woods called asking to justify his visit that he had on 06/15/24. He said there needs to completely be a new diagnosis since 03/27/23 and what he was here for. He said his medications were changed multiple times and wonders why. From 06/16/23 to present how does the diagnosis explain DOS 03/27/23? Please call when ready. Phone number is [phone].

## 2023-10-17 ENCOUNTER — Telehealth: Payer: Self-pay | Admitting: Nurse Practitioner

## 2023-10-17 NOTE — Telephone Encounter (Signed)
 He will have to wait if he only wants to see me.

## 2023-10-17 NOTE — Telephone Encounter (Signed)
 Please advise ......  Copied from CRM [id number]. Topic: Appointments - Scheduling Inquiry for Clinic >> Oct 17, 2023  8:24 AM Miquel SAILOR wrote: Reason for CRM: Patient requesting lab orders full panel and 6 month follow upapp.Looked for app nothing till Nov due to Patient wants Labs on 10/07 and visit on 10/09 or 10/10. Needs call back [phone] >> Oct 17, 2023  8:41 AM Miquel SAILOR wrote: James Woods PCP

## 2023-11-01 ENCOUNTER — Other Ambulatory Visit: Payer: Self-pay | Admitting: Nurse Practitioner

## 2023-11-01 DIAGNOSIS — E782 Mixed hyperlipidemia: Secondary | ICD-10-CM

## 2023-11-03 ENCOUNTER — Telehealth: Payer: Self-pay | Admitting: Nurse Practitioner

## 2023-11-03 NOTE — Telephone Encounter (Signed)
 The patient requested [name] place lab orders before his upcoming appointment on 10/14 (virtual) for medication refills.

## 2023-11-03 NOTE — Telephone Encounter (Signed)
 His last routine was virtual, he needs to be seen in person for this follow up

## 2023-11-04 NOTE — Telephone Encounter (Signed)
 Patient has been scheduled for 09/26 at 8:20 AM with [name]. If the patient is unable to attend, he will need to either reschedule with a different provider or keep his November appointment, as Jessica's schedule is fully booked. Per [name], no virtual appointments the patient must be seen in person.

## 2023-11-04 NOTE — Telephone Encounter (Signed)
 He has appt on 12/19/23 will see him in office at that time

## 2023-11-07 ENCOUNTER — Ambulatory Visit: Payer: Self-pay | Admitting: Nurse Practitioner

## 2023-11-14 ENCOUNTER — Telehealth: Payer: Self-pay

## 2023-11-14 NOTE — Telephone Encounter (Signed)
 Incoming fax received from patients pharmacy to initiate a prior authorization for Mounjaro                  .  PA initiated through covermymeds. Key: [id number]      I called patients pharmacy and left a detailed voicemail notifying them of prior authorization approval.

## 2023-11-25 ENCOUNTER — Ambulatory Visit: Payer: Self-pay | Admitting: Nurse Practitioner

## 2023-12-16 ENCOUNTER — Encounter: Payer: Self-pay | Admitting: Adult Health

## 2023-12-16 ENCOUNTER — Telehealth: Admitting: Adult Health

## 2023-12-16 DIAGNOSIS — F411 Generalized anxiety disorder: Secondary | ICD-10-CM

## 2023-12-16 DIAGNOSIS — F331 Major depressive disorder, recurrent, moderate: Secondary | ICD-10-CM

## 2023-12-16 MED ORDER — CARIPRAZINE HCL 1.5 MG PO CAPS: 1.5000 mg | ORAL_CAPSULE | Freq: Every day | ORAL | 2 refills | Status: DC

## 2023-12-16 MED ORDER — DIAZEPAM 5 MG PO TABS
5.0000 mg | ORAL_TABLET | Freq: Two times a day (BID) | ORAL | 2 refills | Status: DC
Start: 2023-12-16 — End: 2023-12-22

## 2023-12-16 NOTE — Progress Notes (Signed)
 James Woods [id number] 1965-04-05 58 y.o.  Virtual Visit via Video Note  I connected with pt @ on 12/16/23 at  2:00 PM EST by a video enabled telemedicine application and verified that I am speaking with the correct person using two identifiers.   I discussed the limitations of evaluation and management by telemedicine and the availability of in person appointments. The patient expressed understanding and agreed to proceed.  I discussed the assessment and treatment plan with the patient. The patient was provided an opportunity to ask questions and all were answered. The patient agreed with the plan and demonstrated an understanding of the instructions.   The patient was advised to call back or seek an in-person evaluation if the symptoms worsen or if the condition fails to improve as anticipated.  I provided 25 minutes of non-face-to-face time during this encounter.  The patient was located at home.  The provider was located at [facility].   Angeline LOISE Sayers, NP   Subjective:   Patient ID:  James Woods is a 58 y.o. (DOB 17-Sep-1965) male.  Chief Complaint: No chief complaint on file.   HPI James Woods presents for follow-up of MDD and GAD.   Describes mood today as not good. Pleasant, but flat. Reports tearfulness. Mood symptoms - reports depression, anxiety and irritability. Reports decreased interest and motivation. Denies panic attacks. Reports feeling overwhelmed at times. Reports some worry, rumination and over thinking. Denies obsessive thoughts. Denies obsessive acts. Reports mood as variable - it's up and down. Stating I feel like I'm doing very poor. Continues to take the Valium  as needed, but has not been taking the Vraylar  daily. Would like to consider other treatment options. Taking medications as prescribed.  Energy levels are very low. Active, does not have a regular exercise routine - walking 10,000 steps a day. Reports he is unable to enjoy usual  interests and activities - nothing, very detached. Lives with wife. Has 2 grown children. Spending time with family. Appetite suppressed - not eating well. Reports weight loss 225 to 200 pounds. Taking Monjauro - not consistently. Reports sleep has declined. Averages 4 hours at night. Denies daytime napping.  Reports focus and concentration difficulties. Reports concerns he may have ADD and would like to be evaluated. Completing tasks around the house. 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 Reports SI at times - no plan or intent. Reports he can keep him self safe. Denies HI.  Denies AH or VH. Denies substance use. Denies self harm. Reports alcohol consumption - 1 beer daily. Reports vaping tobacco.  Previous medication trials:  Wellbutrin , Celexa, Lexapro , Prozac, Lexapro , Clonazepam, Abilify, Vraylar , Valium   Review of Systems:  Review of Systems  Musculoskeletal:  Negative for gait problem.  Neurological:  Negative for tremors.  Psychiatric/Behavioral:         Please refer to HPI    Medications: I have reviewed the patient's current medications.  Current Outpatient Medications  Medication Sig Dispense Refill   cariprazine  (VRAYLAR ) 1.5 MG capsule Take 1 capsule (1.5 mg total) by mouth daily. 30 capsule 2   diazepam  (VALIUM ) 5 MG tablet Take 1 tablet (5 mg total) by mouth 2 (two) times daily. 60 tablet 2   diphenhydrAMINE HCl, Sleep, (SLEEP AID) 50 MG CAPS Take 1 capsule by mouth at bedtime.     ibuprofen (ADVIL) 200 MG tablet Take 200 mg by mouth as needed.     rosuvastatin  (CRESTOR ) 40 MG tablet TAKE 1 TABLET BY MOUTH EVERY DAY 90 tablet  0   sildenafil  (VIAGRA ) 100 MG tablet TAKE 1 TABLET BY MOUTH AS NEEDED. FOR ERECTILE DYSFUNCTION. 6 tablet 11   testosterone  cypionate (DEPOTESTOSTERONE CYPIONATE) 200 MG/ML injection SMARTSIG:0.4 Milliliter(s) IM Twice a Week     tirzepatide  (MOUNJARO ) 5 MG/0.5ML Pen Inject 5 mg into the skin once a week. 6 mL 1   No current  facility-administered medications for this visit.    Medication Side Effects: None  Allergies:  Allergies  Allergen Reactions   Penicillins Rash    Past Medical History:  Diagnosis Date   Anxiety    Diabetes (HCC)    High cholesterol    Low testosterone  in male    Overweight     Family History  Problem Relation Age of Onset   Stroke Mother    Diabetes Mother    Stroke Father    Esophageal cancer Neg Hx    Stomach cancer Neg Hx    Colon cancer Neg Hx    Rectal cancer Neg Hx     Social History   Socioeconomic History   Marital status: Single    Spouse name: Not on file   Number of children: 2   Years of education: Not on file   Highest education level: Associate degree: academic program  Occupational History   Occupation: retail  Tobacco Use   Smoking status: Every Day    Types: E-cigarettes   Smokeless tobacco: Never   Tobacco comments:    Former smoker, currently vapes daily  Vaping Use   Vaping status: Some Days   Start date: 07/12/2020   Substances: Nicotine, Flavoring  Substance and Sexual Activity   Alcohol use: Yes    Alcohol/week: 7.0 standard drinks of alcohol    Types: 7 Cans of beer per week    Comment: occassionally   Drug use: Never   Sexual activity: Not on file  Other Topics Concern   Not on file  Social History Narrative   Tobacco use:  1/2 pack per day, 30 years   Alcohol use: None   Diet:     Do you drink/eat things with caffeine?  No.   Marital status: Married in 1993.    Do you live in a house, apartment, assisted living, condo, trailer, etc.)? House   Is it one or more stories? One story.   How many persons live in your home?  2   Do you have any pets in your home? (please list)  3 dogs   Highest level of education: - Some college.   Current or past profession:  Retail   Do you exercise:  No.       Advanced Directive   No Living Will, no DNR, no POA/HPOA      FUNCTIONAL STATUS   No difficulty with bathing, dressing,  preparing food, eating, managing medications, finances. No difficulty with affording medications.    Social Drivers of Corporate Investment Banker Strain: Low Risk  (06/09/2023)   Overall Financial Resource Strain (CARDIA)    Difficulty of Paying Living Expenses: Not very hard  Food Insecurity: No Food Insecurity (06/09/2023)   Hunger Vital Sign    Worried About Running Out of Food in the Last Year: Never true    Ran Out of Food in the Last Year: Never true  Transportation Needs: No Transportation Needs (06/09/2023)   PRAPARE - Administrator, Civil Service (Medical): No    Lack of Transportation (Non-Medical): No  Physical Activity: Insufficiently Active (06/09/2023)  Exercise Vital Sign    Days of Exercise per Week: 2 days    Minutes of Exercise per Session: 30 min  Stress: No Stress Concern Present (06/09/2023)   [employer] of Occupational Health - Occupational Stress Questionnaire    Feeling of Stress : Not at all  Social Connections: Moderately Isolated (06/09/2023)   Social Connection and Isolation Panel    Frequency of Communication with Friends and Family: Once a week    Frequency of Social Gatherings with Friends and Family: Never    Attends Religious Services: 1 to 4 times per year    Active Member of [employer] or Organizations: No    Attends Engineer, Structural: Not on file    Marital Status: Married  Catering Manager Violence: Not on file    Past Medical History, Surgical history, Social history, and Family history were reviewed and updated as appropriate.   Please see review of systems for further details on the patient's review from today.   Objective:   Physical Exam:  There were no vitals taken for this visit.  Physical Exam Constitutional:      General: He is not in acute distress. Musculoskeletal:        General: No deformity.  Neurological:     Mental Status: He is alert and oriented to person, place, and time.     Coordination:  Coordination normal.  Psychiatric:        Attention and Perception: Attention and perception normal. He does not perceive auditory or visual hallucinations.        Mood and Affect: Mood normal. Mood is not anxious or depressed. Affect is not labile, blunt, angry or inappropriate.        Speech: Speech normal.        Behavior: Behavior normal.        Thought Content: Thought content normal. Thought content is not paranoid or delusional. Thought content does not include homicidal or suicidal ideation. Thought content does not include homicidal or suicidal plan.        Cognition and Memory: Cognition and memory normal.        Judgment: Judgment normal.     Comments: Insight intact     Lab Review:     Component Value Date/Time   NA 137 07/08/2023 1233   K 4.4 07/08/2023 1233   CL 102 07/08/2023 1233   CO2 24 07/08/2023 1233   GLUCOSE 109 (H) 07/08/2023 1233   BUN 11 07/08/2023 1233   CREATININE 0.95 07/08/2023 1233   CREATININE 0.78 06/06/2023 0948   CALCIUM  9.0 07/08/2023 1233   PROT 7.7 07/08/2023 1233   ALBUMIN 3.7 07/08/2023 1233   AST 47 (H) 07/08/2023 1233   ALT 63 (H) 07/08/2023 1233   ALKPHOS 65 07/08/2023 1233   BILITOT 1.1 07/08/2023 1233   GFRNONAA >60 07/08/2023 1233   GFRNONAA 84 06/01/2020 0813   GFRAA 97 06/01/2020 0813       Component Value Date/Time   WBC 6.0 07/08/2023 1233   RBC 5.69 07/08/2023 1233   HGB 15.8 07/08/2023 1233   HCT 47.5 07/08/2023 1233   PLT 118 (L) 07/08/2023 1233   MCV 83.5 07/08/2023 1233   MCH 27.8 07/08/2023 1233   MCHC 33.3 07/08/2023 1233   RDW 15.6 (H) 07/08/2023 1233   LYMPHSABS 1.3 07/08/2023 1233   MONOABS 0.5 07/08/2023 1233   EOSABS 0.3 07/08/2023 1233   BASOSABS 0.0 07/08/2023 1233    No results found for: POCLITH, LITHIUM  No results found for: PHENYTOIN, PHENOBARB, VALPROATE, CBMZ   .res Assessment: Plan:    Plan:  PDMP reviewed  Continue: Restart Vraylar  1.5mg  daily for mood symptoms - has  not taken consistently - script sent. Valium  5mg  twice daily as needed - script sent  Staff to call with information: Would like to see a therapist for help with narcissism/manipulation. Would also like a referral to [facility] for ADD evaluation.  RTC 4 weeks  25 minutes spent dedicated to the care of this patient on the date of this encounter to include pre-visit review of records, ordering of medication, post visit documentation, and face-to-face time with the patient discussing anxiety. Discussed continuing current medication regimen.  Patient advised to contact office with any questions, adverse effects, or acute worsening in signs and symptoms.  Discussed potential benefits, risk, and side effects of benzodiazepines to include potential risk of tolerance and dependence, as well as possible drowsiness.  Advised patient not to drive if experiencing drowsiness and to take lowest possible effective dose to minimize risk of dependence and tolerance.  There are no diagnoses linked to this encounter.   Please see After Visit Summary for patient specific instructions.  Future Appointments  Date Time Provider Department Center  12/16/2023  2:00 PM Rhonin Trott Nattalie, NP [facility] None  12/22/2023 10:20 AM Caro [name] POUR, NP [facility] [address]    No orders of the defined types were placed in this encounter.     -------------------------------

## 2023-12-17 ENCOUNTER — Telehealth: Payer: Self-pay

## 2023-12-17 NOTE — Telephone Encounter (Signed)
 PA approved Vraylar  1.5 mg [employer] 12/17/23-12/16/25

## 2023-12-19 ENCOUNTER — Ambulatory Visit: Payer: Self-pay | Admitting: Nurse Practitioner

## 2023-12-22 ENCOUNTER — Ambulatory Visit: Admitting: Nurse Practitioner

## 2023-12-22 VITALS — BP 118/70 | HR 82 | Temp 98.1°F | Ht 68.0 in | Wt 199.0 lb

## 2023-12-22 DIAGNOSIS — K760 Fatty (change of) liver, not elsewhere classified: Secondary | ICD-10-CM

## 2023-12-22 DIAGNOSIS — E1169 Type 2 diabetes mellitus with other specified complication: Secondary | ICD-10-CM | POA: Diagnosis not present

## 2023-12-22 DIAGNOSIS — F331 Major depressive disorder, recurrent, moderate: Secondary | ICD-10-CM

## 2023-12-22 DIAGNOSIS — E782 Mixed hyperlipidemia: Secondary | ICD-10-CM | POA: Diagnosis not present

## 2023-12-22 DIAGNOSIS — F411 Generalized anxiety disorder: Secondary | ICD-10-CM

## 2023-12-22 DIAGNOSIS — Z7985 Long-term (current) use of injectable non-insulin antidiabetic drugs: Secondary | ICD-10-CM

## 2023-12-22 DIAGNOSIS — N529 Male erectile dysfunction, unspecified: Secondary | ICD-10-CM

## 2023-12-22 DIAGNOSIS — K5903 Drug induced constipation: Secondary | ICD-10-CM

## 2023-12-22 MED ORDER — DIAZEPAM 5 MG PO TABS: 5.0000 mg | ORAL_TABLET | Freq: Two times a day (BID) | ORAL | 2 refills | Status: DC

## 2023-12-22 MED ORDER — TIRZEPATIDE 5 MG/0.5ML ~~LOC~~ SOAJ: 5.0000 mg | SUBCUTANEOUS | 1 refills | Status: AC

## 2023-12-22 MED ORDER — SILDENAFIL CITRATE 100 MG PO TABS: ORAL_TABLET | ORAL | 11 refills | Status: AC

## 2023-12-22 MED ORDER — CARIPRAZINE HCL 1.5 MG PO CAPS: 1.5000 mg | ORAL_CAPSULE | Freq: Every day | ORAL | 2 refills | Status: AC

## 2023-12-22 MED ORDER — ROSUVASTATIN CALCIUM 40 MG PO TABS: 40.0000 mg | ORAL_TABLET | Freq: Every day | ORAL | 0 refills | Status: AC

## 2023-12-22 NOTE — Progress Notes (Unsigned)
 Careteam: Patient Care Team: James [name] POUR, NP as PCP - General (Geriatric Medicine)  PLACE OF SERVICE:  [facility]  Advanced Directive information    Allergies  Allergen Reactions   Penicillins Rash    Chief Complaint  Patient presents with   Medical Management of Chronic Issues    Patient hasn't been seen since 2024. Would like to discjuss GP1 weight management . Have blood work done - lab [facility]  Patient stated he doesn't want any vaccines  Records requestred from [employer]     HPI:  Discussed the use of AI scribe software for clinical note transcription with the patient, who gave verbal consent to proceed.  History of Present Illness James Woods is a 58 year old male with diabetes and hyperlipidemia who presents for a follow-up visit.  He has not been seen in the office since 2024 and is due for blood work, including an A1c test, as he has not had one since April 2025. His last A1c was 6.4. He is currently taking Mounjaro  for diabetes management and finds it effective for weight loss however would like to lose more weight.   He takes Crestor  daily for hyperlipidemia, with his last LDL level being 59, which is well-controlled. He has a history of mildly elevated liver enzymes and has been working on his diet, resulting in a weight loss from 220 pounds to 199 pounds. He is walking approximately 11,000 steps a day.  He is not currently taking testosterone  as he is trying to conceive, and his urologist has prescribed Clomid.   He has a history of depression and is currently seeing a psychiatrist. He is dissatisfied with his current treatment and is seeking a second opinion. He has stopped taking Vraylar  and is scheduled to see another doctor soon.  No chest pain, palpitations, numbness, or tingling in his legs. He reports occasional constipation, which he attributes to his medication, and acknowledges inconsistent water intake.   He has not seen a  podiatrist recently as his previous one went out of business, and he finds it difficult to manage his nail care.  He has not received a flu shot this year, expressing skepticism about its effectiveness.    He has seen an eye doctor this year and received a new prescription.   Review of Systems:  Review of Systems  Constitutional:  Negative for chills, fever and weight loss.  HENT:  Negative for tinnitus.   Respiratory:  Negative for cough, sputum production and shortness of breath.   Cardiovascular:  Negative for chest pain, palpitations and leg swelling.  Gastrointestinal:  Negative for abdominal pain, constipation, diarrhea and heartburn.  Genitourinary:  Negative for dysuria, frequency and urgency.  Musculoskeletal:  Negative for back pain, falls, joint pain and myalgias.  Skin: Negative.   Neurological:  Negative for dizziness and headaches.  Psychiatric/Behavioral:  Negative for depression and memory loss. The patient does not have insomnia.     Past Medical History:  Diagnosis Date   Anxiety    Diabetes (HCC)    High cholesterol    Low testosterone  in male    Overweight    Past Surgical History:  Procedure Laterality Date   COLONOSCOPY  2018   IRIDOTOMY / IRIDECTOMY Bilateral 11/11/2020   Dr.Groat   None     Social History:   reports that he has been smoking e-cigarettes. He has never used smokeless tobacco. He reports current alcohol use of about 7.0 standard drinks of  alcohol per week. He reports that he does not use drugs.  Family History  Problem Relation Age of Onset   Stroke Mother    Diabetes Mother    Stroke Father    Esophageal cancer Neg Hx    Stomach cancer Neg Hx    Colon cancer Neg Hx    Rectal cancer Neg Hx     Medications: Patient's Medications  New Prescriptions   No medications on file  Previous Medications   DIPHENHYDRAMINE HCL, SLEEP, (SLEEP AID) 50 MG CAPS    Take 1 capsule by mouth at bedtime.   IBUPROFEN (ADVIL) 200 MG TABLET    Take  200 mg by mouth as needed.   TESTOSTERONE  CYPIONATE (DEPOTESTOSTERONE CYPIONATE) 200 MG/ML INJECTION    SMARTSIG:0.4 Milliliter(s) IM Twice a Week  Modified Medications   Modified Medication Previous Medication   CARIPRAZINE  (VRAYLAR ) 1.5 MG CAPSULE cariprazine  (VRAYLAR ) 1.5 MG capsule      Take 1 capsule (1.5 mg total) by mouth daily.    Take 1 capsule (1.5 mg total) by mouth daily.   ROSUVASTATIN  (CRESTOR ) 40 MG TABLET rosuvastatin  (CRESTOR ) 40 MG tablet      Take 1 tablet (40 mg total) by mouth daily.    TAKE 1 TABLET BY MOUTH EVERY DAY   SILDENAFIL  (VIAGRA ) 100 MG TABLET sildenafil  (VIAGRA ) 100 MG tablet      TAKE 1 TABLET BY MOUTH AS NEEDED. FOR ERECTILE DYSFUNCTION.    TAKE 1 TABLET BY MOUTH AS NEEDED. FOR ERECTILE DYSFUNCTION.   TIRZEPATIDE  (MOUNJARO ) 5 MG/0.5ML PEN tirzepatide  (MOUNJARO ) 5 MG/0.5ML Pen      Inject 5 mg into the skin once a week.    Inject 5 mg into the skin once a week.  Discontinued Medications   DIAZEPAM  (VALIUM ) 5 MG TABLET    Take 1 tablet (5 mg total) by mouth 2 (two) times daily.    Physical Exam:  Vitals:   12/22/23 1027  BP: 118/70  Pulse: 82  Temp: 98.1 F (36.7 C)  SpO2: 98%  Weight: 199 lb (90.3 kg)  Height: 5' 8 (1.727 m)   Body mass index is 30.26 kg/m. Wt Readings from Last 3 Encounters:  12/22/23 199 lb (90.3 kg)  07/24/23 220 lb (99.8 kg)  07/22/23 220 lb (99.8 kg)    Physical Exam Constitutional:      General: He is not in acute distress.    Appearance: He is well-developed. He is not diaphoretic.  HENT:     Head: Normocephalic and atraumatic.     Right Ear: External ear normal.     Left Ear: External ear normal.     Mouth/Throat:     Pharynx: No oropharyngeal exudate.  Eyes:     Conjunctiva/sclera: Conjunctivae normal.     Pupils: Pupils are equal, round, and reactive to light.  Cardiovascular:     Rate and Rhythm: Normal rate and regular rhythm.     Heart sounds: Normal heart sounds.  Pulmonary:     Effort: Pulmonary  effort is normal.     Breath sounds: Normal breath sounds.  Abdominal:     General: Bowel sounds are normal.     Palpations: Abdomen is soft.  Musculoskeletal:        General: No tenderness.     Cervical back: Normal range of motion and neck supple.     Right lower leg: No edema.     Left lower leg: No edema.  Skin:    General: Skin is warm  and dry.  Neurological:     Mental Status: He is alert and oriented to person, place, and time.     Labs reviewed: Basic Metabolic Panel: Recent Labs    02/03/23 1541 06/06/23 0948 07/08/23 1233  NA 136 138 137  K 4.3 4.2 4.4  CL 101 101 102  CO2 26 28 24   GLUCOSE 112* 126* 109*  BUN 17 18 11   CREATININE 0.81 0.78 0.95  CALCIUM  9.6 9.2 9.0   Liver Function Tests: Recent Labs    02/03/23 1541 06/06/23 0948 07/08/23 1233  AST 50* 41* 47*  ALT 88* 70* 63*  ALKPHOS  --   --  65  BILITOT 0.8 0.7 1.1  PROT 8.1 7.8 7.7  ALBUMIN  --   --  3.7   Recent Labs    07/08/23 1233  LIPASE 43   No results for input(s): AMMONIA in the last 8760 hours. CBC: Recent Labs    06/06/23 0948 07/08/23 1233  WBC 5.5 6.0  NEUTROABS 2,904 3.9  HGB 16.3 15.8  HCT 50.6* 47.5  MCV 84.1 83.5  PLT 122* 118*   Lipid Panel: Recent Labs    06/06/23 0948  CHOL 123  HDL 37*  LDLCALC 59  TRIG 801*  CHOLHDL 3.3   TSH: No results for input(s): TSH in the last 8760 hours. A1C: Lab Results  Component Value Date   HGBA1C 6.4 (H) 06/06/2023     Assessment/Plan  Type 2 diabetes mellitus with other specified complication, without long-term current use of insulin (HCC) Assessment & Plan: Well-controlled with Mounjaro . Last A1c was 6.4% in April 2025. Mounjaro  preferred for weight loss over Ozempic . - Ordered A1c test. - Continue Mounjaro . - Ensure lab results are sent to provider as he wants to do these at a different lab  Orders: -     Tirzepatide ; Inject 5 mg into the skin once a week.  Dispense: 6 mL; Refill: 1 -     Hemoglobin  A1c -     CBC with Differential/Platelet -     Comprehensive metabolic panel with GFR -     Microalbumin / creatinine urine ratio -     Ambulatory referral to Podiatry  Major depressive disorder, recurrent episode, moderate (HCC) Assessment & Plan: Ongoing, does not feel like symptoms are well managed and plans to get a second opinion from psychiatrist   Orders: -     Cariprazine  HCl; Take 1 capsule (1.5 mg total) by mouth daily.  Dispense: 30 capsule; Refill: 2  Mixed hyperlipidemia Assessment & Plan: Well-controlled with Crestor . Last LDL was 59 mg/dL in April 7974. - Continue Crestor .  Orders: -     Rosuvastatin  Calcium ; Take 1 tablet (40 mg total) by mouth daily.  Dispense: 90 tablet; Refill: 0 -     CBC with Differential/Platelet -     Comprehensive metabolic panel with GFR  Erectile dysfunction, unspecified erectile dysfunction type -     Sildenafil  Citrate; TAKE 1 TABLET BY MOUTH AS NEEDED. FOR ERECTILE DYSFUNCTION.  Dispense: 6 tablet; Refill: 11  Fatty liver Assessment & Plan: Continue dietary modifications   Drug-induced constipation Assessment & Plan: Intermittent constipation, likely medication-related since on mounjaro . Inconsistent water intake reported. - Encouraged increased water intake. - Instructed to monitor bowel movements. Can use stool softener PRN     Return in about 6 months (around 06/20/2024) for routine follow up.  Lyndle Pang K. James BODILY [facility] [phone]

## 2023-12-23 DIAGNOSIS — K5900 Constipation, unspecified: Secondary | ICD-10-CM | POA: Insufficient documentation

## 2023-12-23 DIAGNOSIS — N529 Male erectile dysfunction, unspecified: Secondary | ICD-10-CM | POA: Insufficient documentation

## 2023-12-23 DIAGNOSIS — K760 Fatty (change of) liver, not elsewhere classified: Secondary | ICD-10-CM | POA: Insufficient documentation

## 2023-12-23 DIAGNOSIS — F331 Major depressive disorder, recurrent, moderate: Secondary | ICD-10-CM | POA: Insufficient documentation

## 2023-12-23 NOTE — Assessment & Plan Note (Signed)
 Well-controlled with Mounjaro . Last A1c was 6.4% in April 2025. Mounjaro  preferred for weight loss over Ozempic . - Ordered A1c test. - Continue Mounjaro . - Ensure lab results are sent to provider as he wants to do these at a different lab

## 2023-12-23 NOTE — Assessment & Plan Note (Signed)
 Intermittent constipation, likely medication-related since on mounjaro . Inconsistent water intake reported. - Encouraged increased water intake. - Instructed to monitor bowel movements. Can use stool softener PRN

## 2023-12-23 NOTE — Assessment & Plan Note (Signed)
 Well-controlled with Crestor . Last LDL was 59 mg/dL in April 7974. - Continue Crestor .

## 2023-12-23 NOTE — Assessment & Plan Note (Signed)
 Ongoing, does not feel like symptoms are well managed and plans to get a second opinion from psychiatrist

## 2023-12-23 NOTE — Assessment & Plan Note (Signed)
 Continue dietary modifications

## 2023-12-26 ENCOUNTER — Other Ambulatory Visit: Payer: Self-pay | Admitting: Nurse Practitioner

## 2023-12-29 ENCOUNTER — Ambulatory Visit: Payer: Self-pay | Admitting: Nurse Practitioner

## 2023-12-30 LAB — CBC WITH DIFFERENTIAL/PLATELET
Basophils Absolute: 0 x10E3/uL (ref 0.0–0.2)
Basos: 1 %
EOS (ABSOLUTE): 0.3 x10E3/uL (ref 0.0–0.4)
Eos: 5 %
Hematocrit: 47.8 % (ref 37.5–51.0)
Hemoglobin: 15.2 g/dL (ref 13.0–17.7)
Immature Grans (Abs): 0 x10E3/uL (ref 0.0–0.1)
Immature Granulocytes: 0 %
Lymphocytes Absolute: 1.2 x10E3/uL (ref 0.7–3.1)
Lymphs: 20 %
MCH: 27.3 pg (ref 26.6–33.0)
MCHC: 31.8 g/dL (ref 31.5–35.7)
MCV: 86 fL (ref 79–97)
Monocytes Absolute: 0.4 x10E3/uL (ref 0.1–0.9)
Monocytes: 7 %
Neutrophils Absolute: 4 x10E3/uL (ref 1.4–7.0)
Neutrophils: 67 %
Platelets: 126 x10E3/uL — ABNORMAL LOW (ref 150–450)
RBC: 5.56 x10E6/uL (ref 4.14–5.80)
RDW: 14.2 % (ref 11.6–15.4)
WBC: 5.9 x10E3/uL (ref 3.4–10.8)

## 2023-12-30 LAB — COMPREHENSIVE METABOLIC PANEL WITH GFR
ALT: 29 IU/L (ref 0–44)
AST: 32 IU/L (ref 0–40)
Albumin: 4.3 g/dL (ref 3.8–4.9)
Alkaline Phosphatase: 68 IU/L (ref 47–123)
BUN/Creatinine Ratio: 15 (ref 9–20)
BUN: 14 mg/dL (ref 6–24)
Bilirubin Total: 0.4 mg/dL (ref 0.0–1.2)
CO2: 24 mmol/L (ref 20–29)
Calcium: 10.1 mg/dL (ref 8.7–10.2)
Chloride: 103 mmol/L (ref 96–106)
Creatinine, Ser: 0.92 mg/dL (ref 0.76–1.27)
Globulin, Total: 3.2 g/dL (ref 1.5–4.5)
Glucose: 98 mg/dL (ref 70–99)
Potassium: 4.4 mmol/L (ref 3.5–5.2)
Sodium: 139 mmol/L (ref 134–144)
Total Protein: 7.5 g/dL (ref 6.0–8.5)
eGFR: 97 mL/min/1.73 (ref >59)

## 2023-12-30 LAB — MICROALBUMIN / CREATININE URINE RATIO
Creatinine, Urine: 84.3 mg/dL
Microalb/Creat Ratio: 49 mg/g{creat} — AB (ref 0–29)
Microalbumin, Urine: 41.4 ug/mL

## 2023-12-30 LAB — HGB A1C W/O EAG: Hgb A1c MFr Bld: 5.6 % (ref 4.8–5.6)

## 2024-01-12 ENCOUNTER — Encounter: Payer: Self-pay | Admitting: Nurse Practitioner

## 2024-01-12 NOTE — Telephone Encounter (Signed)
 Form Printed and placed in [name] Dancer, NP folder to review and sign. Once Completed needs to be faxed back to [facility] Fax:[phone]

## 2024-01-13 NOTE — Telephone Encounter (Signed)
Mychart response sent to patient.

## 2024-01-14 NOTE — Telephone Encounter (Signed)
 Signed and given to CI

## 2024-01-14 NOTE — Telephone Encounter (Signed)
 Copy sent for scanning
